# Patient Record
Sex: Female | Born: 1975 | Race: White | Hispanic: No | Marital: Married | Smoking: Never smoker
Health system: Southern US, Community
[De-identification: ages and names within clinical notes are randomized; demographics above are authoritative.]

## PROBLEM LIST (undated history)

## (undated) DIAGNOSIS — I639 Cerebral infarction, unspecified: Secondary | ICD-10-CM

## (undated) DIAGNOSIS — Z953 Presence of xenogenic heart valve: Secondary | ICD-10-CM

## (undated) HISTORY — PX: OTHER SURGICAL HISTORY: SHX169

---

## 2013-09-12 ENCOUNTER — Ambulatory Visit: Payer: Self-pay | Admitting: Emergency Medicine

## 2013-09-12 LAB — RAPID STREP-A WITH REFLX: Micro Text Report: NEGATIVE

## 2013-09-15 LAB — BETA STREP CULTURE(ARMC)

## 2014-05-20 ENCOUNTER — Ambulatory Visit: Payer: Self-pay | Admitting: Surgery

## 2014-05-22 ENCOUNTER — Ambulatory Visit: Payer: Self-pay | Admitting: Gastroenterology

## 2014-10-08 ENCOUNTER — Encounter (HOSPITAL_COMMUNITY)
Admission: EM | Disposition: A | Discharge status: Home / Self Care | Payer: Self-pay | Source: Other Acute Inpatient Hospital | Attending: Neurology

## 2014-10-08 ENCOUNTER — Encounter (HOSPITAL_COMMUNITY): Payer: Self-pay | Admitting: Adult Health

## 2014-10-08 ENCOUNTER — Inpatient Hospital Stay (HOSPITAL_COMMUNITY)
Admission: EM | Admit: 2014-10-08 | Discharge: 2014-10-13 | DRG: 023 | Disposition: A | Discharge status: Home / Self Care | Payer: 59 | Source: Other Acute Inpatient Hospital | Attending: Neurology | Admitting: Neurology

## 2014-10-08 ENCOUNTER — Emergency Department: Payer: Self-pay | Admitting: Emergency Medicine

## 2014-10-08 ENCOUNTER — Emergency Department (HOSPITAL_COMMUNITY): Admission: EM | Admit: 2014-10-08 | Payer: Self-pay | Source: Home / Self Care

## 2014-10-08 ENCOUNTER — Other Ambulatory Visit (HOSPITAL_COMMUNITY): Payer: Self-pay | Admitting: Neurology

## 2014-10-08 ENCOUNTER — Inpatient Hospital Stay (HOSPITAL_COMMUNITY): Payer: 59 | Admitting: Anesthesiology

## 2014-10-08 ENCOUNTER — Inpatient Hospital Stay (HOSPITAL_COMMUNITY): Payer: 59

## 2014-10-08 ENCOUNTER — Ambulatory Visit (HOSPITAL_COMMUNITY)
Admission: RE | Admit: 2014-10-08 | Discharge: 2014-10-08 | Disposition: A | Discharge status: Home / Self Care | Payer: 59 | Source: Ambulatory Visit | Attending: Neurology | Admitting: Neurology

## 2014-10-08 DIAGNOSIS — R9389 Abnormal findings on diagnostic imaging of other specified body structures: Secondary | ICD-10-CM

## 2014-10-08 DIAGNOSIS — Z954 Presence of other heart-valve replacement: Secondary | ICD-10-CM

## 2014-10-08 DIAGNOSIS — E785 Hyperlipidemia, unspecified: Secondary | ICD-10-CM | POA: Insufficient documentation

## 2014-10-08 DIAGNOSIS — R2990 Unspecified symptoms and signs involving the nervous system: Secondary | ICD-10-CM

## 2014-10-08 DIAGNOSIS — Z952 Presence of prosthetic heart valve: Secondary | ICD-10-CM | POA: Diagnosis not present

## 2014-10-08 DIAGNOSIS — I63412 Cerebral infarction due to embolism of left middle cerebral artery: Secondary | ICD-10-CM | POA: Diagnosis present

## 2014-10-08 DIAGNOSIS — H5347 Heteronymous bilateral field defects: Secondary | ICD-10-CM | POA: Diagnosis present

## 2014-10-08 DIAGNOSIS — D72829 Elevated white blood cell count, unspecified: Secondary | ICD-10-CM | POA: Diagnosis present

## 2014-10-08 DIAGNOSIS — I639 Cerebral infarction, unspecified: Secondary | ICD-10-CM

## 2014-10-08 DIAGNOSIS — Q251 Coarctation of aorta: Secondary | ICD-10-CM

## 2014-10-08 DIAGNOSIS — D649 Anemia, unspecified: Secondary | ICD-10-CM | POA: Diagnosis not present

## 2014-10-08 DIAGNOSIS — J9600 Acute respiratory failure, unspecified whether with hypoxia or hypercapnia: Secondary | ICD-10-CM

## 2014-10-08 DIAGNOSIS — R4701 Aphasia: Secondary | ICD-10-CM | POA: Diagnosis present

## 2014-10-08 DIAGNOSIS — R1310 Dysphagia, unspecified: Secondary | ICD-10-CM | POA: Diagnosis not present

## 2014-10-08 DIAGNOSIS — G8191 Hemiplegia, unspecified affecting right dominant side: Secondary | ICD-10-CM | POA: Diagnosis present

## 2014-10-08 DIAGNOSIS — Z6839 Body mass index (BMI) 39.0-39.9, adult: Secondary | ICD-10-CM | POA: Diagnosis not present

## 2014-10-08 DIAGNOSIS — J95821 Acute postprocedural respiratory failure: Secondary | ICD-10-CM | POA: Diagnosis not present

## 2014-10-08 DIAGNOSIS — Z7901 Long term (current) use of anticoagulants: Secondary | ICD-10-CM | POA: Diagnosis not present

## 2014-10-08 DIAGNOSIS — I6789 Other cerebrovascular disease: Secondary | ICD-10-CM

## 2014-10-08 DIAGNOSIS — I10 Essential (primary) hypertension: Secondary | ICD-10-CM | POA: Diagnosis present

## 2014-10-08 DIAGNOSIS — I6522 Occlusion and stenosis of left carotid artery: Secondary | ICD-10-CM | POA: Diagnosis present

## 2014-10-08 HISTORY — DX: Presence of xenogenic heart valve: Z95.3

## 2014-10-08 HISTORY — PX: RADIOLOGY WITH ANESTHESIA: SHX6223

## 2014-10-08 LAB — BLOOD GAS, ARTERIAL
Acid-base deficit: 3 mmol/L — ABNORMAL HIGH (ref 0.0–2.0)
Bicarbonate: 20.2 mEq/L (ref 20.0–24.0)
FIO2: 1 %
MECHVT: 480 mL
O2 Saturation: 99.7 %
PEEP: 5 cmH2O
PH ART: 7.46 — AB (ref 7.350–7.450)
Patient temperature: 98.6
RATE: 16 resp/min
TCO2: 21.1 mmol/L (ref 0–100)
pCO2 arterial: 28.8 mmHg — ABNORMAL LOW (ref 35.0–45.0)
pO2, Arterial: 232 mmHg — ABNORMAL HIGH (ref 80.0–100.0)

## 2014-10-08 LAB — COMPREHENSIVE METABOLIC PANEL
ALK PHOS: 65 U/L (ref 39–117)
ALT: 65 U/L — AB (ref 0–35)
ANION GAP: 8 (ref 5–15)
AST: 56 U/L — ABNORMAL HIGH (ref 0–37)
Albumin: 3.4 g/dL — ABNORMAL LOW (ref 3.5–5.2)
BUN: 8 mg/dL (ref 6–23)
CALCIUM: 8.5 mg/dL (ref 8.4–10.5)
CO2: 22 mmol/L (ref 19–32)
Chloride: 106 mmol/L (ref 96–112)
Creatinine, Ser: 0.73 mg/dL (ref 0.50–1.10)
GFR calc Af Amer: >90 mL/min (ref >90)
Glucose, Bld: 118 mg/dL — ABNORMAL HIGH (ref 70–99)
Potassium: 3.8 mmol/L (ref 3.5–5.1)
SODIUM: 136 mmol/L (ref 135–145)
TOTAL PROTEIN: 6.4 g/dL (ref 6.0–8.3)
Total Bilirubin: 0.8 mg/dL (ref 0.3–1.2)

## 2014-10-08 LAB — CBC
HCT: 36.6 % (ref 36.0–46.0)
HEMOGLOBIN: 11.8 g/dL — AB (ref 12.0–15.0)
MCH: 26.4 pg (ref 26.0–34.0)
MCHC: 32.2 g/dL (ref 30.0–36.0)
MCV: 81.9 fL (ref 78.0–100.0)
PLATELETS: 267 10*3/uL (ref 150–400)
RBC: 4.47 MIL/uL (ref 3.87–5.11)
RDW: 16.1 % — ABNORMAL HIGH (ref 11.5–15.5)
WBC: 17.3 10*3/uL — AB (ref 4.0–10.5)

## 2014-10-08 LAB — MRSA PCR SCREENING: MRSA by PCR: NEGATIVE

## 2014-10-08 SURGERY — RADIOLOGY WITH ANESTHESIA
Anesthesia: General

## 2014-10-08 MED ORDER — PROPOFOL 10 MG/ML IV EMUL
INTRAVENOUS | Status: AC
  Filled 2014-10-08: qty 100

## 2014-10-08 MED ORDER — INFLUENZA VAC SPLIT QUAD 0.5 ML IM SUSY
0.5000 mL | PREFILLED_SYRINGE | INTRAMUSCULAR | Status: DC
  Filled 2014-10-08: qty 0.5

## 2014-10-08 MED ORDER — LACTATED RINGERS IV SOLN
INTRAVENOUS | Status: DC | PRN
  Administered 2014-10-08: 10:00:00 via INTRAVENOUS

## 2014-10-08 MED ORDER — LIDOCAINE HCL 1 % IJ SOLN
INTRAMUSCULAR | Status: AC
  Filled 2014-10-08: qty 20

## 2014-10-08 MED ORDER — STROKE: EARLY STAGES OF RECOVERY BOOK
Freq: Once | Status: AC
  Administered 2014-10-08: 13:00:00
  Filled 2014-10-08: qty 1

## 2014-10-08 MED ORDER — EPTIFIBATIDE 2 MG/ML IV SOLN
INTRAVENOUS | Status: AC
  Administered 2014-10-08 (×3): 2 mg
  Administered 2014-10-08: 1 mg
  Filled 2014-10-08: qty 10

## 2014-10-08 MED ORDER — ACETAMINOPHEN 650 MG RE SUPP
650.0000 mg | Freq: Four times a day (QID) | RECTAL | Status: DC | PRN
Start: 2014-10-08 — End: 2014-10-10

## 2014-10-08 MED ORDER — CHLORHEXIDINE GLUCONATE 0.12 % MT SOLN
15.0000 mL | Freq: Two times a day (BID) | OROMUCOSAL | Status: DC
  Administered 2014-10-08 – 2014-10-09 (×2): 15 mL via OROMUCOSAL
  Filled 2014-10-08 (×2): qty 15

## 2014-10-08 MED ORDER — EPHEDRINE SULFATE 50 MG/ML IJ SOLN
INTRAMUSCULAR | Status: DC | PRN
  Administered 2014-10-08: 5 mg via INTRAVENOUS

## 2014-10-08 MED ORDER — CEFAZOLIN SODIUM-DEXTROSE 2-3 GM-% IV SOLR
INTRAVENOUS | Status: AC
  Filled 2014-10-08: qty 50

## 2014-10-08 MED ORDER — FENTANYL CITRATE 0.05 MG/ML IJ SOLN
25.0000 ug | INTRAMUSCULAR | Status: DC | PRN
  Administered 2014-10-08 (×2): 50 ug via INTRAVENOUS
  Administered 2014-10-09 (×2): 25 ug via INTRAVENOUS
  Administered 2014-10-09 (×2): 50 ug via INTRAVENOUS
  Filled 2014-10-08 (×6): qty 2

## 2014-10-08 MED ORDER — HEPARIN SODIUM (PORCINE) 1000 UNIT/ML IJ SOLN
INTRAMUSCULAR | Status: DC | PRN
  Administered 2014-10-08: 1000 [IU] via INTRAVENOUS

## 2014-10-08 MED ORDER — SUCCINYLCHOLINE CHLORIDE 20 MG/ML IJ SOLN
INTRAMUSCULAR | Status: DC | PRN
  Administered 2014-10-08: 100 mg via INTRAVENOUS

## 2014-10-08 MED ORDER — FENTANYL CITRATE 0.05 MG/ML IJ SOLN
INTRAMUSCULAR | Status: DC | PRN
  Administered 2014-10-08 (×2): 25 ug via INTRAVENOUS
  Administered 2014-10-08: 50 ug via INTRAVENOUS
  Administered 2014-10-08 (×2): 25 ug via INTRAVENOUS

## 2014-10-08 MED ORDER — EPTIFIBATIDE 2 MG/ML IV SOLN
INTRAVENOUS | Status: AC
  Filled 2014-10-08: qty 10

## 2014-10-08 MED ORDER — CETYLPYRIDINIUM CHLORIDE 0.05 % MT LIQD
7.0000 mL | Freq: Four times a day (QID) | OROMUCOSAL | Status: DC
  Administered 2014-10-09 (×2): 7 mL via OROMUCOSAL

## 2014-10-08 MED ORDER — NICARDIPINE HCL IN NACL 20-0.86 MG/200ML-% IV SOLN
5.0000 mg/h | INTRAVENOUS | Status: DC
  Administered 2014-10-08: 5 mg/h via INTRAVENOUS

## 2014-10-08 MED ORDER — PROPOFOL 10 MG/ML IV BOLUS
INTRAVENOUS | Status: DC | PRN
  Administered 2014-10-08: 20 mg via INTRAVENOUS
  Administered 2014-10-08: 120 mg via INTRAVENOUS
  Administered 2014-10-08 (×3): 10 mg via INTRAVENOUS
  Administered 2014-10-08: 30 mg via INTRAVENOUS

## 2014-10-08 MED ORDER — ARTIFICIAL TEARS OP OINT
TOPICAL_OINTMENT | OPHTHALMIC | Status: DC | PRN
  Administered 2014-10-08: 1 via OPHTHALMIC

## 2014-10-08 MED ORDER — PHENYLEPHRINE HCL 10 MG/ML IJ SOLN
INTRAMUSCULAR | Status: DC | PRN
  Administered 2014-10-08: 120 ug via INTRAVENOUS

## 2014-10-08 MED ORDER — ASPIRIN 300 MG RE SUPP
300.0000 mg | Freq: Every day | RECTAL | Status: DC
  Administered 2014-10-08 – 2014-10-09 (×2): 300 mg via RECTAL
  Filled 2014-10-08 (×3): qty 1

## 2014-10-08 MED ORDER — ROCURONIUM BROMIDE 100 MG/10ML IV SOLN
INTRAVENOUS | Status: DC | PRN
  Administered 2014-10-08: 15 mg via INTRAVENOUS
  Administered 2014-10-08: 10 mg via INTRAVENOUS
  Administered 2014-10-08: 25 mg via INTRAVENOUS
  Administered 2014-10-08: 50 mg via INTRAVENOUS

## 2014-10-08 MED ORDER — NICARDIPINE HCL IN NACL 20-0.86 MG/200ML-% IV SOLN
INTRAVENOUS | Status: AC
Start: 2014-10-08 — End: 2014-10-08
  Filled 2014-10-08: qty 200

## 2014-10-08 MED ORDER — PANTOPRAZOLE SODIUM 40 MG IV SOLR
40.0000 mg | INTRAVENOUS | Status: DC
  Administered 2014-10-08: 40 mg via INTRAVENOUS
  Filled 2014-10-08: qty 40

## 2014-10-08 MED ORDER — PROPOFOL 10 MG/ML IV EMUL
5.0000 ug/kg/min | INTRAVENOUS | Status: DC
  Administered 2014-10-08: 20 ug/kg/min via INTRAVENOUS
  Administered 2014-10-08: 10 ug/kg/min via INTRAVENOUS
  Administered 2014-10-09: 15 ug/kg/min via INTRAVENOUS
  Filled 2014-10-08 (×2): qty 100

## 2014-10-08 MED ORDER — ACETAMINOPHEN 500 MG PO TABS
1000.0000 mg | ORAL_TABLET | Freq: Four times a day (QID) | ORAL | Status: DC | PRN
  Administered 2014-10-09 – 2014-10-10 (×2): 1000 mg via ORAL
  Filled 2014-10-08 (×2): qty 2

## 2014-10-08 MED ORDER — ONDANSETRON HCL 4 MG/2ML IJ SOLN
INTRAMUSCULAR | Status: DC | PRN
  Administered 2014-10-08: 4 mg via INTRAVENOUS

## 2014-10-08 MED ORDER — NITROGLYCERIN 1 MG/10 ML FOR IR/CATH LAB
INTRA_ARTERIAL | Status: AC
  Administered 2014-10-08 (×5): 25 ug
  Filled 2014-10-08: qty 10

## 2014-10-08 MED ORDER — ONDANSETRON HCL 4 MG/2ML IJ SOLN: 4.0000 mg | Freq: Once | INTRAMUSCULAR | Status: DC | PRN

## 2014-10-08 MED ORDER — IOHEXOL 300 MG/ML  SOLN
150.0000 mL | Freq: Once | INTRAMUSCULAR | Status: AC | PRN
  Administered 2014-10-08: 150 mL via INTRAVENOUS

## 2014-10-08 MED ORDER — ASPIRIN 325 MG PO TABS
325.0000 mg | ORAL_TABLET | Freq: Every day | ORAL | Status: DC
  Administered 2014-10-10 – 2014-10-13 (×4): 325 mg via ORAL
  Filled 2014-10-08 (×5): qty 1

## 2014-10-08 MED ORDER — ONDANSETRON HCL 4 MG/2ML IJ SOLN: 4.0000 mg | Freq: Four times a day (QID) | INTRAMUSCULAR | Status: DC | PRN

## 2014-10-08 MED ORDER — PROPOFOL 10 MG/ML IV EMUL: 5.0000 ug/kg/min | INTRAVENOUS | Status: DC

## 2014-10-08 MED ORDER — SODIUM CHLORIDE 0.9 % IV SOLN
INTRAVENOUS | Status: DC
  Administered 2014-10-08 – 2014-10-09 (×3): via INTRAVENOUS

## 2014-10-08 MED ORDER — CEFAZOLIN SODIUM-DEXTROSE 2-3 GM-% IV SOLR
INTRAVENOUS | Status: DC | PRN
  Administered 2014-10-08: 2 g via INTRAVENOUS

## 2014-10-08 MED ORDER — LIDOCAINE HCL (CARDIAC) 20 MG/ML IV SOLN
INTRAVENOUS | Status: DC | PRN
  Administered 2014-10-08: 100 mg via INTRAVENOUS

## 2014-10-08 MED ORDER — HYDROMORPHONE HCL 1 MG/ML IJ SOLN: 0.2500 mg | INTRAMUSCULAR | Status: DC | PRN

## 2014-10-08 NOTE — Anesthesia Postprocedure Evaluation (Signed)
  Anesthesia Post-op Note  Patient: Nichole Black  Procedure(s) Performed: Procedure(s): RADIOLOGY WITH ANESTHESIA (N/A)  Patient Location: SICU  Anesthesia Type:General  Level of Consciousness: sedated and Patient remains intubated per anesthesia plan  Airway and Oxygen Therapy: Patient remains intubated per anesthesia plan and Patient placed on Ventilator (see vital sign flow sheet for setting)  Post-op Pain: none  Post-op Assessment: Post-op Vital signs reviewed, Patient's Cardiovascular Status Stable, Respiratory Function Stable, Patent Airway and No signs of Nausea or vomiting  Post-op Vital Signs: stable  Last Vitals:  Filed Vitals:   10/08/14 1300  BP: 127/69  Pulse: 77  Resp: 27    Complications: No apparent anesthesia complications

## 2014-10-08 NOTE — Anesthesia Procedure Notes (Signed)
Procedure Name: Intubation Date/Time: 10/08/2014 9:39 AM Performed by: Fransisca KaufmannMEYER, Irianna Gilday E Pre-anesthesia Checklist: Patient identified, Emergency Drugs available, Suction available, Patient being monitored and Timeout performed Patient Re-evaluated:Patient Re-evaluated prior to inductionOxygen Delivery Method: Circle system utilized Preoxygenation: Pre-oxygenation with 100% oxygen Intubation Type: IV induction and Rapid sequence Ventilation: Mask ventilation without difficulty Grade View: Grade I Tube type: Subglottic suction tube Tube size: 7.5 mm Number of attempts: 1 Airway Equipment and Method: Stylet Placement Confirmation: ETT inserted through vocal cords under direct vision and positive ETCO2 Secured at: 23 cm Tube secured with: Tape Dental Injury: Teeth and Oropharynx as per pre-operative assessment

## 2014-10-08 NOTE — Transfer of Care (Signed)
Immediate Anesthesia Transfer of Care Note  Patient: Nichole Black  Procedure(s) Performed: Procedure(s): RADIOLOGY WITH ANESTHESIA (N/A)  Patient Location: PACU and NICU  Anesthesia Type:General  Level of Consciousness: Patient remains intubated per anesthesia plan  Airway & Oxygen Therapy: Patient remains intubated per anesthesia plan and Patient placed on Ventilator (see vital sign flow sheet for setting)  Post-op Assessment: Report given to RN and Post -op Vital signs reviewed and stable  Post vital signs: Reviewed and stable  Last Vitals:  Filed Vitals:   10/08/14 1235  Pulse: 60  Resp: 20    Complications: No apparent anesthesia complications

## 2014-10-08 NOTE — Progress Notes (Signed)
VASCULAR LAB PRELIMINARY  PRELIMINARY  PRELIMINARY  PRELIMINARY  Carotid Duplex completed.    Preliminary report:  RT ICA/CCA ratio: 1.06                                 LT ICA/CCA ratio:  1.72                                 1-39% RICA stenosis.                                40-69% LICA stenosis.                     Bilateral antegrade vertebral arterial flow.  Loralie ChampagneBishop, Kairen Hallinan F, RVT 10/08/2014, 4:23 PM

## 2014-10-08 NOTE — Progress Notes (Signed)
10/08/2014 1510  Interventional Radiology here at bedside to remove 499fr right groin sheath.  Pt id'ed by name and DOB.  59fr groin sheath removed. ExoSeal used,  20mins of arterial pressure until hemostasis achieved.  V-Pad applied, along with pressure dressing. RN at bedside.  No immediate complications noted. Distal pulses DP+2, PT+2.   jkc/bf

## 2014-10-08 NOTE — Progress Notes (Signed)
Revascularization time 1033

## 2014-10-08 NOTE — H&P (Signed)
Neurology H&P  CC: aphasia  History is obtained from: family, referring MD  HPI: Nichole Black is a 39 y.o. female who presented to [place] this am with right sided weakness and aphasia. Initially, she was still able to speak and told the ER physician that symptoms started about 2:30am. She was mild at first, however and got her children ready for school. Her husband then noticed her facial droop and took her to [place] ER where she continued to worsen. She was [facility] and taken directly to IR.   Her head CT shows some mild changes in the insular ribbon and parietal region but still less than 1/3 MCA territory.  Per family, she has had trouble getting her INR within range lately.    LKW: 2:30am tpa given?: no, out of window.  NIHSS: 21   ROS:  Unable to obtain due to altered mental status.   PMH: Bicuspid aortic valve s/p replacement x 2. Most recent was mechanical.   Family History: No history of similar presentation  Social History: TOB: family denies.   Exam: Current vital signs: LMP 10/03/2014 (Exact Date) Vital signs in last 24 hours:   Physical Exam  Constitutional: Appears well-developed and well-nourished.  Psych: Affect appropriate to situation Eyes: No scleral injection HENT: No OP obstrucion Head: Normocephalic.  Cardiovascular: Normal rate and regular rhythm.  Respiratory: Effort normal and breath sounds normal to anterior ascultation GI: Soft.  No distension. There is no tenderness.  Skin: WDI  Neuro: Mental Status: Patient is awake, alert, she has no verbal output but is able to answer some simple questions with nods/shakes. She follows some simple commnads, but unreliably.  Cranial Nerves: II: Right hemianopia. Pupils are equal, round, and reactive to light.   III,IV, VI: Gaze preference but easily tracks across midline.  V: Facial sensation is diminished on right VII: Facial movement is diminished on right.  VIII: hearing is intact to  voice XII: does not follow command to protrude Motor: Tone is normal. Bulk is normal. 5/5 strength was present on the left, no movement right arm, only able to wiggle toes in the leg.  Sensory: Sensation is diminished on the right.  Deep Tendon Reflexes: 2+ and symmetric in the biceps and patellae.  Cerebellar: Does not follow commands.     I have reviewed labs in epic and the results pertinent to this consultation are: WBC - leukocytosis.   I have reviewed the images obtained: CT head - changes as above.   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 She is being taken to IR for attempt for reperfusion. This is likely secondary to subtheraputic INR in the setting of mechanical valve.   Recommendations: 1. IR attempt at reperfusion 2. HgbA1c, fasting lipid panel 3. MRI the brain without contrast to evaluate extent odf the stroke 3. Frequent neuro checks 4. Echocardiogram 5. Admit to ICU  6. Prophylactic therapy-Antiplatelet med: Aspirin - dose 325mg  PO or 300mg  PR 7. Telemetry monitoring 8. PT consult, OT consult, Speech consult    This patient is critically ill and at significant risk of neurological worsening, death and care requires constant monitoring of vital signs, hemodynamics,respiratory and cardiac monitoring, neurological assessment, discussion with family, other specialists and medical decision making of high complexity. I spent 80 minutes of neurocritical care time  in the care of  this patient.  Ritta SlotMcNeill Tymeer Vaquera, MD [facility] [phone]  If 7pm- 7am, please page neurology on call as listed in AMION. 10/08/2014  10:50  AM    

## 2014-10-08 NOTE — Procedures (Signed)
S/P 4 vessel cerebral arteriogram. RT CFA approach. Findings. 1Complete revascularizatiom of occluded LT ICA terminus ,Lt MCA with x3 passes of Solitaire FR 4mm x 40 mm retrieval ,and 7 mg of IA INtegrelin superselectivelly  TICI 3 revascularization

## 2014-10-08 NOTE — Progress Notes (Signed)
1st puncture in IR 2- code stroke

## 2014-10-08 NOTE — Sedation Documentation (Signed)
1st puncture in !R 2- code stroke at 0945

## 2014-10-08 NOTE — Consult Note (Signed)
PULMONARY / CRITICAL CARE MEDICINE   Name: Nichole Black MRN: [id number] DOB: 1976-05-15    ADMISSION DATE:  10/08/2014 CONSULTATION DATE:  10/08/14  REFERRING MD :  Amada JupiterKirkpatrick   CHIEF COMPLAINT:  Vent management   INITIAL PRESENTATION: 39 yo female with hx mechanical aortic valve repalcement x 2, presented 3[facility] with R sided weakness and aphasia beginning approx 2am.  She was tx [facility] and taken directly to IR for attempts at revascularization.  PCCM consulted for post-procedure vent management.   STUDIES:  CT head ([place]) 3/2>>> mild changes in the insular ribbon and parietal region but still less than 1/3 MCA territory.  SIGNIFICANT EVENTS: 3/2 IR revascularization >>> occluded L ICA, L MCA with successful revascularization   HISTORY OF PRESENT ILLNESS:  39 yo female with hx mechanical aortic valve repalcement x 2, presented [facility] to [facility] with R sided weakness and aphasia beginning approx 2am.  [facility] and taken directly to IR for attempts at revascularization.  PCCM consulted for post-procedure vent management.   PAST MEDICAL HISTORY :   has a past medical history of S/P aortic valve replacement.  has no past surgical history on file. Prior to Admission medications   Not on File   Not on File  FAMILY HISTORY:  has no family status information on file.  SOCIAL HISTORY:    REVIEW OF SYSTEMS:  Unable, pt sedated on vent post procedure   SUBJECTIVE:   VITAL SIGNS: Pulse Rate:  [60-77] 77 (03/02 1300) Resp:  [20-27] 27 (03/02 1300) BP: (127)/(69) 127/69 mmHg (03/02 1300) SpO2:  [100 %] 100 % (03/02 1300) Arterial Line BP: (160)/(73) 160/73 mmHg (03/02 1300) FiO2 (%):  [100 %] 100 % (03/02 1235) Weight:  [215 lb 6.2 oz (97.7 kg)] 215 lb 6.2 oz (97.7 kg) (03/02 1300) HEMODYNAMICS:   VENTILATOR SETTINGS: Vent Mode:  [-] PRVC FiO2 (%):  [100 %] 100 % Set Rate:  [16 bmp] 16 bmp Vt Set:  [480 mL] 480 mL PEEP:  [5 cmH20]  5 cmH20 Plateau Pressure:  [19 cmH20] 19 cmH20 INTAKE / OUTPUT:  Intake/Output Summary (Last 24 hours) at 10/08/14 1329 Last data filed at 10/08/14 1245  Gross per 24 hour  Intake    600 ml  Output   1060 ml  Net   -460 ml    PHYSICAL EXAMINATION: General:  wdwn young female, NAD sedated post IR procedure  Neuro:  RASS -1, opens eyes to voice, follows commands. R hemiparesis HEENT:  Mm dry, no JVD, ETT  Cardiovascular:  s1s2 rrr, mechanical click  Lungs:  resps even, non labored, scattered rhonchi  Abdomen:  Round, soft, +bs Musculoskeletal:  Warm and dry, no edema   LABS:  CBC No results for input(s): WBC, HGB, HCT, PLT in the last 168 hours. Coag's No results for input(s): APTT, INR in the last 168 hours. BMET No results for input(s): NA, K, CL, CO2, BUN, CREATININE, GLUCOSE in the last 168 hours. Electrolytes No results for input(s): CALCIUM, MG, PHOS in the last 168 hours. Sepsis Markers No results for input(s): LATICACIDVEN, PROCALCITON, O2SATVEN in the last 168 hours. ABG No results for input(s): PHART, PCO2ART, PO2ART in the last 168 hours. Liver Enzymes No results for input(s): AST, ALT, ALKPHOS, BILITOT, ALBUMIN in the last 168 hours. Cardiac Enzymes No results for input(s): TROPONINI, PROBNP in the last 168 hours. Glucose No results for input(s): GLUCAP in the last 168 hours.  Imaging No  results found.   ASSESSMENT / PLAN:  NEUROLOGIC Acute L ICA/ L MCA stroke s/p IR revascularization in setting mechanical aortic valve (per husband pt has had difficulty getting INR therapeutic lately) P:   RASS goal: -1 WUA in am  Neuro primary  Propofol gtt  PRN fent  MRI pending   PULMONARY Vent support post Neuro IR procedure  P:   Cont vent support  SBT, WUA in am  F/u CXR  F/u ABG   CARDIOVASCULAR Hx aortic valve replacement r/t bicuspid valve  HTN P:  Hold anticoag for now s/p procedure  Will need to d/w neuro to determine timing for resuming  anticoagulation  Cont cardene gtt for BP control  Echo pending   RENAL No active issue  P:   Chem pending   GASTROINTESTINAL No active issue  P:   NPO for now  Hold off on TF as hope for extubation in am  PPI   HEMATOLOGIC Coagulopathy - anticoagulated at baseline in setting mechanic valve  P:  Cbc pending  F/u INR  Hold anticoagulation for now  SCD's    INFECTIOUS No active issue  P:   Monitor WBC, fever curve off abx   ENDOCRINE No active issue    P:   Monitor glucose on chem     FAMILY  - Updates:  Husband, sister in law updated at length at bedside   - Inter-disciplinary family meet or Palliative Care meeting due by: 3/9   Dirk Dress, NP 10/08/2014  1:29 PM Pager: [phone] or [phone]   PCCM ATTENDING: I have reviewed pt's initial presentation, consultants notes and hospital database in detail.  The above assessment and plan was formulated under my direction.  In summary: 66 F with mechanical heart valve Acute L hemispheric CVA s/p IR revasc procedure Subtherapeutic on warfarin (INR 1.5 @ [place])  Cont full vent support - settings reviewed and/or adjusted Cont vent bundle Daily SBT if/when meets criteria Likely extubation 3/03 SUP and DVT px confirmed Further neuro eval per Stroke team Stroke team to decide on timing of resumption of full anticoagulation for mech heart valve   Billy Fischer, MD;  PCCM service; Mobile [phone]

## 2014-10-08 NOTE — Progress Notes (Signed)
  Echocardiogram 2D Echocardiogram has been performed.  Delcie RochENNINGTON, Tod Abrahamsen 10/08/2014, 2:36 PM

## 2014-10-08 NOTE — Plan of Care (Signed)
Problem: tPA Day Progression Outcomes-Only if tPA administered Goal: Inclusion criteria for tPA STANDARD: < 3hours from symptoms onset: - Diagnosis of ischemic stroke causing measureable neurological deficit - Neurological signs shold not be minor & isolated - Symptoms of stroke should not be sugestive of SAH - No head trauma or prior stroke in previous 3 months - No gastrointestinal or urinary tract hemorrhage in previous 21 days - No arterial puncture at a noncompressible site in previous 7 days - BP not elevated [systolic <459 mmHg, diastolic < 977 mmHg] - Not taking oral anticoagulant or if being taken INR < or equal 1.7 - Platelet count > or equal 100,000 mm3 - No seizure w/postictal residual neurological impairments - Pt/family understand potential risks/benefits from treatment - Caution in pts with NIHSS > 22 - Seizure at stroke onset eligible for tPA if residual impairments due to stroke and not the seizures. - Neurological signs should not be clearing spontaneously - Exercise caution in treating pt with major deficits - Symptoms onset < 3hrs before beginning treatment - No MI in previous 3 months - No major surgery in previous 14 days - No history previous intracranial hemorrhage - No evidence active bleeding/acute trauma (fracture) on examinations - If receiving heparin in previous 48 hrs, aPTT must be normal range - Abnormal Blood Glucose < 50 or > 400 mg/dL - CT does not show multilobar infarction [hypodensity >1/3 cerebral hemisphere] EXTENDED: 3-4.5 hrous from symptom onset - Age > 80 years - History of prior stroke and diabetes - Any anticoagulant use prior to admission (even if INR < 1.7) - NIHSS > 25" Outcome: Completed/Met Date Met:  10/08/14 Pt outside of tpa window.  Did not receive tpa

## 2014-10-08 NOTE — Progress Notes (Signed)
Clarification: Revascularization time was 1055; not 1033

## 2014-10-08 NOTE — Progress Notes (Signed)
UR completed.  Carlton Sweaney, RN BSN MHA CCM Trauma/Neuro ICU Case Manager [phone]  

## 2014-10-08 NOTE — Progress Notes (Signed)
eLink Physician-Brief Progress Note Patient Name: Nichole Black DOB: 06/26/1976 MRN: [id number]   Date of Service  10/08/2014  HPI/Events of Note  Called for mechanical ventilator orders. Will order current ventilator settings.  eICU Interventions  Will order 40%/PRVC 16/TV 480/P 5.     Intervention Category Major Interventions: Respiratory failure - evaluation and management  Corban Kistler Eugene 10/08/2014, 8:10 PM

## 2014-10-08 NOTE — Anesthesia Preprocedure Evaluation (Signed)
Anesthesia Evaluation  Patient identified by MRN, date of birth, ID band Patient unresponsive  Preop documentation limited or incomplete due to emergent nature of procedure.  Airway        Dental   Pulmonary          Cardiovascular     Neuro/Psych    GI/Hepatic   Endo/Other    Renal/GU      Musculoskeletal   Abdominal   Peds  Hematology   Anesthesia Other Findings   Reproductive/Obstetrics                             Anesthesia Physical Anesthesia Plan  ASA: IV  Anesthesia Plan: General   Post-op Pain Management:    Induction: Intravenous  Airway Management Planned: Oral ETT  Additional Equipment: Arterial line  Intra-op Plan:   Post-operative Plan: Post-operative intubation/ventilation  Informed Consent:   Plan Discussed with: CRNA, Anesthesiologist and Surgeon  Anesthesia Plan Comments:         Anesthesia Quick Evaluation

## 2014-10-09 ENCOUNTER — Encounter (HOSPITAL_COMMUNITY): Payer: Self-pay | Admitting: Radiology

## 2014-10-09 ENCOUNTER — Inpatient Hospital Stay (HOSPITAL_COMMUNITY): Payer: 59

## 2014-10-09 DIAGNOSIS — J95821 Acute postprocedural respiratory failure: Secondary | ICD-10-CM

## 2014-10-09 DIAGNOSIS — E785 Hyperlipidemia, unspecified: Secondary | ICD-10-CM

## 2014-10-09 DIAGNOSIS — G8190 Hemiplegia, unspecified affecting unspecified side: Secondary | ICD-10-CM

## 2014-10-09 DIAGNOSIS — Z952 Presence of prosthetic heart valve: Secondary | ICD-10-CM | POA: Insufficient documentation

## 2014-10-09 DIAGNOSIS — Z7901 Long term (current) use of anticoagulants: Secondary | ICD-10-CM | POA: Insufficient documentation

## 2014-10-09 DIAGNOSIS — I63412 Cerebral infarction due to embolism of left middle cerebral artery: Principal | ICD-10-CM

## 2014-10-09 LAB — LIPID PANEL
CHOL/HDL RATIO: 4.2 ratio
CHOLESTEROL: 148 mg/dL (ref 0–200)
HDL: 35 mg/dL — ABNORMAL LOW (ref >39)
LDL Cholesterol: 80 mg/dL (ref 0–99)
Triglycerides: 166 mg/dL — ABNORMAL HIGH (ref <150)
VLDL: 33 mg/dL (ref 0–40)

## 2014-10-09 LAB — BASIC METABOLIC PANEL
Anion gap: 10 (ref 5–15)
BUN: 8 mg/dL (ref 6–23)
CALCIUM: 8.3 mg/dL — AB (ref 8.4–10.5)
CO2: 23 mmol/L (ref 19–32)
CREATININE: 0.64 mg/dL (ref 0.50–1.10)
Chloride: 104 mmol/L (ref 96–112)
GFR calc Af Amer: >90 mL/min (ref >90)
GFR calc non Af Amer: >90 mL/min (ref >90)
Glucose, Bld: 108 mg/dL — ABNORMAL HIGH (ref 70–99)
POTASSIUM: 3.7 mmol/L (ref 3.5–5.1)
SODIUM: 137 mmol/L (ref 135–145)

## 2014-10-09 LAB — CBC WITH DIFFERENTIAL/PLATELET
BASOS ABS: 0 10*3/uL (ref 0.0–0.1)
Basophils Relative: 0 % (ref 0–1)
EOS ABS: 0 10*3/uL (ref 0.0–0.7)
EOS PCT: 0 % (ref 0–5)
HEMATOCRIT: 32.9 % — AB (ref 36.0–46.0)
Hemoglobin: 10.3 g/dL — ABNORMAL LOW (ref 12.0–15.0)
Lymphocytes Relative: 17 % (ref 12–46)
Lymphs Abs: 1.9 10*3/uL (ref 0.7–4.0)
MCH: 25.6 pg — ABNORMAL LOW (ref 26.0–34.0)
MCHC: 31.3 g/dL (ref 30.0–36.0)
MCV: 81.8 fL (ref 78.0–100.0)
MONO ABS: 1 10*3/uL (ref 0.1–1.0)
Monocytes Relative: 9 % (ref 3–12)
Neutro Abs: 8.3 10*3/uL — ABNORMAL HIGH (ref 1.7–7.7)
Neutrophils Relative %: 74 % (ref 43–77)
PLATELETS: 231 10*3/uL (ref 150–400)
RBC: 4.02 MIL/uL (ref 3.87–5.11)
RDW: 16.1 % — ABNORMAL HIGH (ref 11.5–15.5)
WBC: 11.2 10*3/uL — AB (ref 4.0–10.5)

## 2014-10-09 LAB — PROTIME-INR
INR: 1.49 (ref 0.00–1.49)
Prothrombin Time: 18.2 seconds — ABNORMAL HIGH (ref 11.6–15.2)

## 2014-10-09 MED ORDER — WARFARIN - PHARMACIST DOSING INPATIENT: Freq: Every day | Status: DC

## 2014-10-09 MED ORDER — CETYLPYRIDINIUM CHLORIDE 0.05 % MT LIQD: 7.0000 mL | Freq: Two times a day (BID) | OROMUCOSAL | Status: DC

## 2014-10-09 MED ORDER — PRAVASTATIN SODIUM 20 MG PO TABS
20.0000 mg | ORAL_TABLET | Freq: Every day | ORAL | Status: DC
  Administered 2014-10-09 – 2014-10-12 (×4): 20 mg via ORAL
  Filled 2014-10-09 (×5): qty 1

## 2014-10-09 MED ORDER — PANTOPRAZOLE SODIUM 40 MG PO TBEC
40.0000 mg | DELAYED_RELEASE_TABLET | Freq: Every day | ORAL | Status: DC
  Administered 2014-10-09 – 2014-10-13 (×5): 40 mg via ORAL
  Filled 2014-10-09 (×5): qty 1

## 2014-10-09 MED ORDER — ENOXAPARIN SODIUM 40 MG/0.4ML ~~LOC~~ SOLN
40.0000 mg | SUBCUTANEOUS | Status: DC
  Administered 2014-10-09 – 2014-10-12 (×4): 40 mg via SUBCUTANEOUS
  Filled 2014-10-09 (×5): qty 0.4

## 2014-10-09 MED ORDER — WARFARIN SODIUM 10 MG PO TABS
10.0000 mg | ORAL_TABLET | Freq: Once | ORAL | Status: AC
  Administered 2014-10-09: 10 mg via ORAL
  Filled 2014-10-09: qty 1

## 2014-10-09 MED ORDER — CHLORHEXIDINE GLUCONATE 0.12 % MT SOLN: 15.0000 mL | Freq: Two times a day (BID) | OROMUCOSAL | Status: DC

## 2014-10-09 NOTE — Consult Note (Signed)
Physical Medicine and Rehabilitation Consult  Reason for Consult: Right sided weakness with expressive deficits.  Referring Physician:  Dr. Roda Shutters.    HPI: Nichole Black is a 39 y.o. female with history of mechanical AVR x 2 who was admitted via [facility] on 10/08/14 with right sided weakness, right facial weakness and aphasia. Patient on chronic coumadin and has had recent difficulty maintaining INR in therapeutic range. CT head with mild changes in the insular ribbon and parietal region and patient underwent CTA with complete revascularization of occluded LT ICA terminus ,Lt MCA with x3 passes of Solitaire with clot retrieval and IA tPA. MRI brain done revealing multifocal acute ischemia L-MCA with greater than expected punctate foci. Carotid dopplers with 40-69% L-ICA stenosis.  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 To increase ASA to 325 mg till INR therapeutic. Patient extubated on 03/03 and therapy evaluations initiated. Patient with right hemiparesis and mild expressive aphasia.  Amb with PT just hours after extubation, already minA ~168ft Pt up with RN to BR at SBA level today  Review of Systems  Constitutional: Negative.   HENT: Negative.   Eyes: Negative.   Respiratory: Negative.   Cardiovascular: Negative.   Gastrointestinal: Negative.   Genitourinary: Negative.   Musculoskeletal: Negative.   Skin: Negative.   Endo/Heme/Allergies: Negative.       Past Medical History  Diagnosis Date  . S/P aortic valve replacement with bioprosthetic valve     Past Surgical History  Procedure Laterality Date  . Carbomedic top hat supra-annular aortic valve    . Radiology with anesthesia N/A 10/08/2014    Procedure: RADIOLOGY WITH ANESTHESIA;  Surgeon: Oneal Grout, MD;  Location: [facility] OR;  Service: Radiology;  Laterality: N/A;    No family history on file.    Social History:  has no tobacco,  alcohol, and drug history on file.    Allergies  Allergen Reactions  . Warfarin Sodium Itching and Other (See Comments)    Cannot take Generic, only brand name  . Hydrocodone Itching   Medications Prior to Admission  Medication Sig Dispense Refill  . warfarin (COUMADIN) 7.5 MG tablet Take 7.5 mg by mouth every evening. M,W,F take 1 tablet, all other days take 1/2 tablet    . aspirin EC 81 MG tablet Take 81 mg by mouth daily.    Marland Kitchen ibuprofen (ADVIL,MOTRIN) 200 MG tablet Take 200 mg by mouth every 6 (six) hours as needed.      Home: Home Living Family/patient expects to be discharged to:: Private residence Living Arrangements: Spouse/significant other, Children, Other relatives Available Help at Discharge: Family, Available 24 hours/day Type of Home: House Home Access: Stairs to enter Secretary/administrator of Steps: 3 Entrance Stairs-Rails: Right Home Layout: Able to live on main level with bedroom/bathroom Home Equipment: Shower seat - built in Additional Comments: Kids are 4,6,9,16; pt's mother-in-law is going to be able to be there 24/7  Lives With: Spouse, Son  Functional History: Prior Function Level of Independence: Independent Functional Status:  Mobility: Bed Mobility Overal bed mobility: Needs Assistance Bed Mobility: Supine to Sit, Sit to Supine Supine to sit: Supervision, HOB elevated Sit to supine: Supervision General bed mobility comments: transition fairly coordinated with R side lagging slightly Transfers Overall transfer level: Needs assistance Equipment used: 1 person hand held assist Transfers: Sit to/from Stand Sit to Stand: Min assist General transfer comment: Increased time to come to  full upright Ambulation/Gait Ambulation/Gait assistance: Min assist Ambulation Distance (Feet): 94 Feet Assistive device: 1 person hand held assist Gait Pattern/deviations: Step-through pattern, Decreased step length - right, Decreased stance time - right Gait  velocity: slower and guarded General Gait Details: slow and guarded with short steps,decreased time spent on R LE, narrowed BOS and dimminished/uncoordinated heel/toe gait.    ADL: ADL Overall ADL's : Needs assistance/impaired Eating/Feeding: NPO Grooming: Moderate assistance, Sitting (with dominant hand) Upper Body Bathing: Minimal assitance, Sitting Lower Body Bathing: Minimal assistance (with Mod A sit<>stand) Upper Body Dressing : Minimal assistance, Sitting Lower Body Dressing: Moderate assistance (with Mod A sit<>stand) Toilet Transfer: Moderate assistance, Ambulation (bed>around>sit in recliner behind her at her room door) Toileting- Clothing Manipulation and Hygiene: Minimal assistance (with Mod A sit<>stand)  Cognition: Cognition Overall Cognitive Status: Within Functional Limits for tasks assessed Arousal/Alertness: Awake/alert Orientation Level: Oriented X4 Attention: Sustained Sustained Attention: Appears intact Memory: Impaired Memory Impairment: Retrieval deficit, Decreased recall of new information Awareness: Appears intact Problem Solving: Appears intact Safety/Judgment: Appears intact Cognition Arousal/Alertness: Awake/alert Behavior During Therapy: WFL for tasks assessed/performed Overall Cognitive Status: Within Functional Limits for tasks assessed  Blood pressure 110/51, pulse 71, temperature 98 F (36.7 C), temperature source Oral, resp. rate 15, height 5\' 6"  (1.676 m), weight 97.7 kg (215 lb 6.2 oz), last menstrual period 10/03/2014, SpO2 93 %. Physical Exam  Nursing note and vitals reviewed. Constitutional: She is oriented to person, place, and time. She appears well-developed and well-nourished.  HENT:  Head: Normocephalic and atraumatic.  Eyes: Conjunctivae and EOM are normal. Pupils are equal, round, and reactive to light.  Neck: Normal range of motion.  Cardiovascular: Normal rate and regular rhythm.   Mechanical valve click   Respiratory:  Effort normal and breath sounds normal.  GI: Soft. Bowel sounds are normal.  Musculoskeletal: Normal range of motion.  Neurological: She is alert and oriented to person, place, and time.  Motor 5-/5 in R del, bi, tri, grip, HF, KE, ADF 5/5 on left side  LT intact in UE and LE although sensation feels different side to side No evidence of Ataxia  Psychiatric: She has a normal mood and affect.    Results for orders placed or performed during the hospital encounter of 10/08/14 (from the past 24 hour(s))  Protime-INR     Status: Abnormal   Collection Time: 10/10/14  2:30 AM  Result Value Ref Range   Prothrombin Time 18.2 (H) 11.6 - 15.2 seconds   INR 1.49 0.00 - 1.49  CBC     Status: Abnormal   Collection Time: 10/10/14  2:30 AM  Result Value Ref Range   WBC 10.4 4.0 - 10.5 K/uL   RBC 3.97 3.87 - 5.11 MIL/uL   Hemoglobin 10.2 (L) 12.0 - 15.0 g/dL   HCT 16.1 (L) 09.6 - 04.5 %   MCV 82.4 78.0 - 100.0 fL   MCH 25.7 (L) 26.0 - 34.0 pg   MCHC 31.2 30.0 - 36.0 g/dL   RDW 40.9 (H) 81.1 - 91.4 %   Platelets 236 150 - 400 K/uL  Basic metabolic panel     Status: None   Collection Time: 10/10/14  2:30 AM  Result Value Ref Range   Sodium 139 135 - 145 mmol/L   Potassium 3.9 3.5 - 5.1 mmol/L   Chloride 107 96 - 112 mmol/L   CO2 25 19 - 32 mmol/L   Glucose, Bld 99 70 - 99 mg/dL   BUN 10 6 - 23  mg/dL   Creatinine, Ser 1.610.80 0.50 - 1.10 mg/dL   Calcium 8.6 8.4 - 09.610.5 mg/dL   GFR calc non Af Amer >90 >90 mL/min   GFR calc Af Amer >90 >90 mL/min   Anion gap 7 5 - 15   Ct Head Wo Contrast  10/08/2014   CLINICAL DATA:  Right-sided weakness. Stroke intervention earlier today.  EXAM: CT HEAD WITHOUT CONTRAST  TECHNIQUE: Contiguous axial images were obtained from the base of the skull through the vertex without intravenous contrast.  COMPARISON:  CT hand intervention films earlier same day  FINDINGS: Post intervention scan shows intravascular contrast. There is staining in the left MCA branches  probably indicating the absence of flow. There is loss of gray-white differentiation developing in the left temporal lobe in frontoparietal region consistent with infarction in the left MCA territory. No swelling or mass effect at this time. No intraparenchymal hematoma. No hydrocephalus. No extra-axial collection.  IMPRESSION: Loss of gray-white differentiation an early swelling in the left temporal lobe and frontoparietal region consistent with left MCA region infarction. Hyperdensity associated with left MCA branches related to contrast persistence falling intervention, likely indicating absence of flow in those vessels.   Electronically Signed   By: Paulina FusiMark  Shogry M.D.   On: 10/08/2014 12:29   Mr Maxine GlennMra Head Wo Contrast  10/09/2014   CLINICAL DATA:  RIGHT-sided weakness and aphasia at 2:30 a.m., RIGHT facial droop. Patient was taken for emergent angiography, status post revascularization of occluded LEFT internal carotid artery terminus extending to LEFT middle cerebral artery. Subsequent the without evaluation.  EXAM: MRI HEAD WITHOUT CONTRAST  MRA HEAD WITHOUT CONTRAST  TECHNIQUE: Multiplanar, multiecho pulse sequences of the brain and surrounding structures were obtained without intravenous contrast. Angiographic images of the head were obtained using MRA technique without contrast.  COMPARISON:  CT of the head October 08, 2014  FINDINGS: MRI HEAD FINDINGS  Patchy areas of reduced diffusion within the LEFT insula, subcentimeter area reduced diffusion in LEFT corona radiata, which show low ADC values consistent with acute ischemia. Scattered predominately tiny peripheral foci of susceptibility artifact in the supratentorial brain. No midline shift, mass effect or mass lesions.  Scattered subcentimeter white matter supratentorial T2 hyperintensities. Minimal FLAIR hyperintense signal within the LEFT insula. No abnormal extra-axial fluid collections.  Mild paranasal sinus mucosal thickening with sphenoid air-fluid  level. The mastoid air cells are well aerated. Ocular globes and orbital contents are unremarkable. No abnormal sellar expansion. No cerebellar tonsillar ectopia. No suspicious calvarial bone marrow signal.  MRA HEAD FINDINGS  Anterior circulation: Normal flow related enhancement of the included cervical, petrous, cavernous and supra clinoid internal carotid arteries. Patent anterior communicating artery. 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 Normal flow related enhancement of the anterior and middle cerebral arteries, including more distal segments. Mild luminal regularity of the LEFT M1 segment.  No large vessel occlusion, high-grade stenosis, aneurysm.  Posterior circulation: Codominant vertebral arteries. Basilar artery is patent, with normal flow related enhancement of the main branch vessels. Asymmetric decreased flow related enhancement of the mid to distal RIGHT posterior cerebral artery.  No large vessel occlusion, high-grade stenosis, abnormal luminal irregularity, aneurysm.  IMPRESSION: MRI HEAD: Multifocal acute ischemia within LEFT middle cerebral artery territory.  Greater than expected punctate foci of susceptibility artifact, greater than expected for age. Differential diagnosis includes prior head injury, remote PRES, hypertensive etiology. Recommend followup MRI of the brain within 6 months including gadolinium  to verify stability of findings.  MRA HEAD: No large vessel occlusion, mild luminal irregularity of the LEFT M1 segment may reflect sequelae of recent recanalization, atherosclerosis.  Slight decreased flow related enhancement of the mid to distal RIGHT posterior cerebral artery may reflect atherosclerosis, or even artifact.   Electronically Signed   By: Awilda Metro   On: 10/09/2014 03:54   Mr Brain Wo Contrast  10/09/2014   CLINICAL DATA:  RIGHT-sided weakness and aphasia at 2:30 a.m., RIGHT facial droop.  Patient was taken for emergent angiography, status post revascularization of occluded LEFT internal carotid artery terminus extending to LEFT middle cerebral artery. Subsequent the without evaluation.  EXAM: MRI HEAD WITHOUT CONTRAST  MRA HEAD WITHOUT CONTRAST  TECHNIQUE: Multiplanar, multiecho pulse sequences of the brain and surrounding structures were obtained without intravenous contrast. Angiographic images of the head were obtained using MRA technique without contrast.  COMPARISON:  CT of the head October 08, 2014  FINDINGS: MRI HEAD FINDINGS  Patchy areas of reduced diffusion within the LEFT insula, subcentimeter area reduced diffusion in LEFT corona radiata, which show low ADC values consistent with acute ischemia. Scattered predominately tiny peripheral foci of susceptibility artifact in the supratentorial brain. No midline shift, mass effect or mass lesions.  Scattered subcentimeter white matter supratentorial T2 hyperintensities. Minimal FLAIR hyperintense signal within the LEFT insula. No abnormal extra-axial fluid collections.  Mild paranasal sinus mucosal thickening with sphenoid air-fluid level. The mastoid air cells are well aerated. Ocular globes and orbital contents are unremarkable. No abnormal sellar expansion. No cerebellar tonsillar ectopia. No suspicious calvarial bone marrow signal.  MRA HEAD FINDINGS  Anterior circulation: Normal flow related enhancement of the included cervical, petrous, cavernous and supra clinoid internal carotid arteries. Patent anterior communicating artery. 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 Normal flow related enhancement of the anterior and middle cerebral arteries, including more distal segments. Mild luminal regularity of the LEFT M1 segment.  No large vessel occlusion, high-grade stenosis, aneurysm.  Posterior circulation: Codominant vertebral arteries. Basilar artery is patent, with normal  flow related enhancement of the main branch vessels. Asymmetric decreased flow related enhancement of the mid to distal RIGHT posterior cerebral artery.  No large vessel occlusion, high-grade stenosis, abnormal luminal irregularity, aneurysm.  IMPRESSION: MRI HEAD: Multifocal acute ischemia within LEFT middle cerebral artery territory.  Greater than expected punctate foci of susceptibility artifact, greater than expected for age. Differential diagnosis includes prior head injury, remote PRES, hypertensive etiology. Recommend followup MRI of the brain within 6 months including gadolinium to verify stability of findings.  MRA HEAD: No large vessel occlusion, mild luminal irregularity of the LEFT M1 segment may reflect sequelae of recent recanalization, atherosclerosis.  Slight decreased flow related enhancement of the mid to distal RIGHT posterior cerebral artery may reflect atherosclerosis, or even artifact.   Electronically Signed   By: Awilda Metro   On: 10/09/2014 03:54   Dg Chest Port 1 View  10/08/2014   CLINICAL DATA:  Aortic valve replacement with bioprosthetic valve. Stroke.  EXAM: PORTABLE CHEST - 1 VIEW  COMPARISON:  10/08/2014.  FINDINGS: Support apparatus: Endotracheal tube tip is 2.8 cm from the carina. Monitoring leads project over the chest.  Cardiomediastinal Silhouette: Within normal limits, accentuated by the low volumes. Median sternotomy and valve replacement.  Lungs: There is increased density in the RIGHT upper lobe. This is not have an appearance typical for pneumothorax. This is favored to represent a skin  fold. Pulmonary parenchymal opacities/airspace disease is less likely. Attention on follow-up is recommended. No pneumothorax.  Effusions:  None.  Other:  Surgical clips in the RIGHT axilla  IMPRESSION: 1. Endotracheal intubation with the tip 2.8 cm from the carina. 2. Opacity in the RIGHT upper lobe is new compared to recent prior exam, likely due to a skin fold or artifactual.  Attention on follow-up recommended.   Electronically Signed   By: Andreas Newport M.D.   On: 10/08/2014 16:23    Assessment/Plan: Diagnosis: Left MCA infarct with minimal hemiparesis after revascularization 1. Does the need for close, 24 hr/day medical supervision in concert with the patient's rehab needs make it unreasonable for this patient to be served in a less intensive setting? No 2. Co-Morbidities requiring supervision/potential complications: AVR 3. Due to patient education, does the patient require 24 hr/day rehab nursing? No 4. Does the patient require coordinated care of a physician, rehab nurse, PT, OT to address physical and functional deficits in the context of the above medical diagnosis(es)? No Addressing deficits in the following areas: balance, endurance, locomotion, strength, transferring, bathing, dressing and toileting 5. Can the patient actively participate in an intensive therapy program of at least 3 hrs of therapy per day at least 5 days per week? Yes 6. The potential for patient to make measurable gains while on inpatient rehab is poor since pt already at high level 7. Anticipated functional outcomes upon discharge from inpatient rehab are n/a  with PT, n/a with OT, n/a with SLP. 8. Estimated rehab length of stay to reach the above functional goals is: NA 9. Does the patient have adequate social supports and living environment to accommodate these discharge functional goals? N/A 10. Anticipated D/C setting: Home 11. Anticipated post D/C treatments: Outpatient therapy 12. Overall Rehab/Functional Prognosis: excellent  RECOMMENDATIONS: This patient's condition is appropriate for continued rehabilitative care in the following setting: Outpatient Therapy Patient has agreed to participate in recommended program. Yes Note that insurance prior authorization may be required for reimbursement for recommended care.  Comment: Already at [facility], rec oupt rehab at  [facility]    10/10/2014

## 2014-10-09 NOTE — Evaluation (Signed)
Clinical/Bedside Swallow Evaluation Patient Details  Name: Nichole Black MRN: [id number] Date of Birth: 07-16-1976  Today's Date: 10/09/2014 Time: SLP Start Time (ACUTE ONLY): 1412 SLP Stop Time (ACUTE ONLY): 1422 SLP Time Calculation (min) (ACUTE ONLY): 10 min  Past Medical History:  Past Medical History  Diagnosis Date  . S/P aortic valve replacement with bioprosthetic valve    Past Surgical History:  Past Surgical History  Procedure Laterality Date  . Carbomedic top hat supra-annular aortic valve     HPI:  Nichole Black is a 39 y.o. female who presented to [place] this am with right sided weakness and aphasia, [facility]--found to have occluded L ICA, L MCA with successful revascularization, vented and sedated. 10/09/14 extubated PMHx: mechanical aortic valve repalcement x 2   Assessment / Plan / Recommendation Clinical Impression  Pt has mildly hoarse voice with low vocal intensity and frequent throat clearing, likely due to extubation this morning. Intermittent throat clearing was observed throughout PO intake, however did not appear to increase in frequency or immediately follow swallows. Pt consumed 3 oz of water across consecutive sips without overt signs of aspiration. Given suspected pharyngeal irritation after brief intubation, would recommend Dys 3 diet and thin liquids without straw. SLP to follow for likely quick advancement and liberalization of precautions.    Aspiration Risk  Mild    Diet Recommendation Dysphagia 3 (Mechanical Soft);Thin liquid   Liquid Administration via: Cup;No straw Medication Administration: Whole meds with puree Supervision: Patient able to self feed;Intermittent supervision to cue for compensatory strategies Compensations: Slow rate;Small sips/bites Postural Changes and/or Swallow Maneuvers: Seated upright 90 degrees    Other  Recommendations Oral Care Recommendations: Oral care BID   Follow Up Recommendations   (none  anticipated for swallowing)    Frequency and Duration min 1 x/week  1 week   Pertinent [facility]    SLP Swallow Goals     Swallow Study Prior Functional Status  Type of Home: House Available Help at Discharge: Family;Available 24 hours/day    General Date of Onset: 10/08/14 HPI: Nichole Black is a 39 y.o. female who presented to [place] this am with right sided weakness and aphasia, [facility]--found to have occluded L ICA, L MCA with successful revascularization, vented and sedated. 10/09/14 extubated PMHx: mechanical aortic valve repalcement x 2 Type of Study: Bedside swallow evaluation Previous Swallow Assessment: none in chart Diet Prior to this Study: NPO Temperature Spikes Noted: Yes Respiratory Status: Room air History of Recent Intubation: Yes Length of Intubations (days): 1 days Date extubated: 10/09/14 Behavior/Cognition: Alert;Cooperative;Pleasant mood Oral Cavity - Dentition: Adequate natural dentition Self-Feeding Abilities: Able to feed self Patient Positioning: Upright in chair Baseline Vocal Quality: Hoarse;Low vocal intensity (mild)    Oral/Motor/Sensory Function Overall Oral Motor/Sensory Function: Appears within functional limits for tasks assessed   Ice Chips Ice chips: Impaired Presentation: Spoon Pharyngeal Phase Impairments: Throat Clearing - Delayed   Thin Liquid Thin Liquid: Impaired Presentation: Cup;Self Fed;Straw Pharyngeal  Phase Impairments: Throat Clearing - Delayed    Nectar Thick Nectar Thick Liquid: Not tested   Honey Thick Honey Thick Liquid: Not tested   Puree Puree: Impaired Presentation: Self Fed;Spoon Pharyngeal Phase Impairments: Throat Clearing - Delayed   Solid     Solid: Impaired Presentation: Self Fed Pharyngeal Phase Impairments: Throat Clearing - Delayed      Maxcine HamLaura Paiewonsky, M.A. CCC-SLP [phone]  Maxcine Hamaiewonsky, Coralie Stanke 10/09/2014,2:51 PM

## 2014-10-09 NOTE — Evaluation (Signed)
Occupational Therapy Evaluation Patient Details Name: Nichole Black MRN: [id number] DOB: 05/26/1976 Today's Date: 10/09/2014    History of Present Illness Nichole Black is a 39 y.o. female who presented to [place] this am with right sided weakness and aphasia, transferred to [facility] directly going to IR--found to have occluded L ICA, L MCA with successful revascularization, vented and sedated. 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 She would greatly benefit from a short stay on inpatient rehab to get back to an independent level herself as quickly as possible to then also focus on getting back to helping take care of her kids.    Follow Up Recommendations  CIR    Equipment Recommendations  None recommended by OT    Recommendations for Other Services Rehab consult     Precautions / Restrictions Precautions Precautions: Fall Restrictions Weight Bearing Restrictions: No      Mobility Bed Mobility Overal bed mobility: Needs Assistance Bed Mobility: Supine to Sit     Supine to sit: Supervision;HOB elevated        Transfers Overall transfer level: Needs assistance Equipment used: 1 person hand held assist Transfers: Sit to/from Stand Sit to Stand: Mod assist         General transfer comment: Increased time to come to full upright    Balance Overall balance assessment: Needs assistance Sitting-balance support: No upper extremity supported;Feet supported Sitting balance-Leahy Scale: Good     Standing balance support: Single extremity supported Standing balance-Leahy Scale: Poor                              ADL Overall  ADL's : Needs assistance/impaired Eating/Feeding: NPO   Grooming: Moderate assistance;Sitting (with dominant hand)   Upper Body Bathing: Minimal assitance;Sitting   Lower Body Bathing: Minimal assistance (with Mod A sit<>stand)   Upper Body Dressing : Minimal assistance;Sitting   Lower Body Dressing: Moderate assistance (with Mod A sit<>stand)   Toilet Transfer: Moderate assistance;Ambulation (bed>around>sit in recliner behind her at her room door)   Toileting- Architect and Hygiene: Minimal assistance (with Mod A sit<>stand)               Vision Vision Assessment?: Yes Eye Alignment: Within Functional Limits Ocular Range of Motion: Within Functional Limits Alignment/Gaze Preference: Within Defined Limits Tracking/Visual Pursuits: Able to track stimulus in all quads without difficulty Saccades: Within functional limits Convergence: Within functional limits Visual Fields: No apparent deficits Additional Comments: wears gradiant bifocals, reports it may be a little more blurry than normal          Pertinent Vitals/Pain Pain Assessment: No/denies pain     Hand Dominance Right   Extremity/Trunk Assessment Upper Extremity Assessment Upper Extremity Assessment: RUE deficits/detail RUE Deficits / Details: Movements slow and laborous compared to LUE RUE Sensation: decreased proprioception (mild decrease for grip; good for finger opposition with eyes closed) RUE Coordination: decreased fine motor;decreased gross motor   Lower Extremity Assessment Lower Extremity Assessment: Defer to PT evaluation       Communication Communication Communication: No difficulties   Cognition Arousal/Alertness: Awake/alert Behavior During Therapy:  WFL for tasks assessed/performed Overall Cognitive Status: Within Functional Limits for tasks assessed                                Home Living Family/patient expects to be discharged to:: Private residence Living  Arrangements: Spouse/significant other;Children;Other relatives Available Help at Discharge: Family;Available 24 hours/day Type of Home: House Home Access: Stairs to enter Entergy CorporationEntrance Stairs-Number of Steps: 3 Entrance Stairs-Rails: Right Home Layout: Able to live on main level with bedroom/bathroom     Bathroom Shower/Tub: Walk-in Pensions consultantshower;Curtain   Bathroom Toilet: Standard     Home Equipment: Shower seat - built in   Additional Comments: Kids are 4,6,9,16; pt's mother-in-law is going to be able to be there 24/7      Prior Functioning/Environment Level of Independence: Independent             OT Diagnosis: Generalized weakness;Hemiplegia dominant side   OT Problem List: Decreased strength;Decreased activity tolerance;Impaired balance (sitting and/or standing);Impaired sensation;Impaired UE functional use;Obesity;Decreased knowledge of use of DME or AE;Impaired tone   OT Treatment/Interventions: Self-care/ADL training;Therapeutic exercise;Therapeutic activities;DME and/or AE instruction;Patient/family education;Balance training    OT Goals(Current goals can be found in the care plan section) Acute Rehab OT Goals Patient Stated Goal: to be able to go home and take care of kids OT Goal Formulation: With patient Time For Goal Achievement: 10/16/14 Potential to Achieve Goals: Good  OT Frequency: Min 3X/week              End of Session Equipment Utilized During Treatment: Gait belt Nurse Communication: Mobility status  Activity Tolerance: Patient limited by fatigue Patient left: in chair;with call bell/phone within reach;with family/visitor present   Time: 1318-1350 OT Time Calculation (min): 32 min Charges:  OT General Charges $OT Visit: 1 Procedure OT Evaluation $Initial OT Evaluation Tier I: 1 Procedure OT Treatments $Self Care/Home Management : 8-22 mins  Evette GeorgesLeonard, Melonee Gerstel Eva [phone] 10/09/2014, 2:12 PM

## 2014-10-09 NOTE — Progress Notes (Signed)
Rehab Admissions Coordinator Note:  Patient was screened by Trish MageLogue, Lanice Folden M for appropriateness for an Inpatient Acute Rehab Consult.  At this time, we are recommending Inpatient Rehab consult.  Lelon FrohlichLogue, Talan Gildner M 10/09/2014, 4:06 PM  I can be reached at [phone].

## 2014-10-09 NOTE — Evaluation (Signed)
Physical Therapy Evaluation Patient Details Name: Nichole Black MRN: [id number] DOB: 1976/03/18 Today's Date: 10/09/2014   History of Present Illness  Nichole Black is a 39 y.o. female who presented to [place] this am with right sided weakness and aphasia, transferred to [facility] directly going to IR--found to have occluded L ICA, L MCA with successful revascularization, vented and sedated. 10/09/14 extubated PMHx:  mechanical aortic valve repalcement x 2  Clinical Impression  Pt admitted with/for R sided weakness and aphasia.  Pt currently limited functionally due to the problems listed. ( See problems list.)   Pt will benefit from PT to maximize function and safety in order to get ready for next venue listed below.     Follow Up Recommendations CIR    Equipment Recommendations  None recommended by PT    Recommendations for Other Services       Precautions / Restrictions Precautions Precautions: Fall Restrictions Weight Bearing Restrictions: No      Mobility  Bed Mobility Overal bed mobility: Needs Assistance Bed Mobility: Supine to Sit;Sit to Supine     Supine to sit: Supervision;HOB elevated Sit to supine: Supervision   General bed mobility comments: transition fairly coordinated with R side lagging slightly  Transfers Overall transfer level: Needs assistance Equipment used: 1 person hand held assist Transfers: Sit to/from Stand Sit to Stand: Min assist         General transfer comment: Increased time to come to full upright  Ambulation/Gait Ambulation/Gait assistance: Min assist Ambulation Distance (Feet): 94 Feet Assistive device: 1 person hand held assist Gait Pattern/deviations: Step-through pattern;Decreased step length - right;Decreased stance time - right Gait velocity: slower and guarded   General Gait Details: slow and guarded with short steps,decreased time spent on R LE, narrowed BOS and dimminished/uncoordinated heel/toe gait.  Stairs             Wheelchair Mobility    Modified Rankin (Stroke Patients Only) Modified Rankin (Stroke Patients Only) Pre-Morbid Rankin Score: No symptoms Modified Rankin: Moderately severe disability     Balance Overall balance assessment: Needs assistance Sitting-balance support: No upper extremity supported Sitting balance-Leahy Scale: Good     Standing balance support: Single extremity supported Standing balance-Leahy Scale: Poor Standing balance comment: expect will balance in standing without support soon                             Pertinent Vitals/Pain Pain Assessment: No/denies pain    Home Living Family/patient expects to be discharged to:: Private residence Living Arrangements: Spouse/significant other;Children;Other relatives Available Help at Discharge: Family;Available 24 hours/day Type of Home: House Home Access: Stairs to enter Entrance Stairs-Rails: Right Entrance Stairs-Number of Steps: 3 Home Layout: Able to live on main level with bedroom/bathroom Home Equipment: Shower seat - built in Additional Comments: Kids are 4,6,9,16; pt's mother-in-law is going to be able to be there 24/7    Prior Function Level of Independence: Independent               Hand Dominance   Dominant Hand: Right    Extremity/Trunk Assessment   Upper Extremity Assessment: RUE deficits/detail RUE Deficits / Details: Movements slow and laborous compared to LUE   RUE Sensation: decreased proprioception (mild decrease for grip; good for finger opposition with eyes closed)     Lower Extremity Assessment: RLE deficits/detail RLE Deficits / Details: grossly 4/5 in quads/hams and hip flexors with noticeable fatigue, df/pf [facility]  Communication   Communication: No difficulties  Cognition Arousal/Alertness: Awake/alert Behavior During Therapy: WFL for tasks assessed/performed Overall Cognitive Status: Within Functional Limits for tasks assessed                       General Comments General comments (skin integrity, edema, etc.): vitals maintained stable    Exercises        Assessment/Plan    PT Assessment Patient needs continued PT services  PT Diagnosis Abnormality of gait;Other (comment) (R hemiparesis)   PT Problem List Decreased strength;Decreased activity tolerance;Decreased balance;Decreased mobility;Decreased coordination  PT Treatment Interventions Gait training;Functional mobility training;Therapeutic activities;Balance training;Patient/family education;Neuromuscular re-education;Stair training   PT Goals (Current goals can be found in the Care Plan section) Acute Rehab PT Goals Patient Stated Goal: to be able to go home and take care of kids PT Goal Formulation: With patient Time For Goal Achievement: 10/16/14 Potential to Achieve Goals: Good    Frequency Min 4X/week   Barriers to discharge        Co-evaluation               End of Session   Activity Tolerance: Patient tolerated treatment well Patient left: in bed;with call bell/phone within reach;with family/visitor present Nurse Communication: Mobility status         Time: 1645-1710 PT Time Calculation (min) (ACUTE ONLY): 25 min   Charges:   PT Evaluation $Initial PT Evaluation Tier I: 1 Procedure PT Treatments $Gait Training: 8-22 mins   PT G Codes:        Nichole Black, Eliseo GumKenneth V 10/09/2014, 5:26 PM 10/09/2014  [place] BingKen Yuvia Black, PT [phone]  (pager)

## 2014-10-09 NOTE — Evaluation (Signed)
Speech Language Pathology Evaluation Patient Details Name: Nichole Black MRN: [id number] DOB: [birth year] Today's Date: 10/09/2014 Time: [id number] SLP Time Calculation (min) (ACUTE ONLY): 16 min  Problem List:  Patient Active Problem List   Diagnosis Date Noted  . Acute postoperative respiratory failure 10/09/2014  . Stroke 10/08/2014   Past Medical History:  Past Medical History  Diagnosis Date  . S/P aortic valve replacement with bioprosthetic valve    Past Surgical History:  Past Surgical History  Procedure Laterality Date  . Carbomedic top hat supra-annular aortic valve     HPI:  Nichole Black is a 39 y.o. female who presented to [facility] this am with right sided weakness and aphasia, [facility]--found to have occluded L ICA, L MCA with successful revascularization, vented and sedated. 10/09/14 extubated PMHx: mechanical aortic valve repalcement x 2   Assessment / Plan / Recommendation Clinical Impression  Pt has a mild expressive aphasia, characterized by word finding difficulties at the conversational level and during cognitively challenging tasks. Deficits were also noted with retrieval of new information. Pt will benefit from skilled SLP services to maximize cognitive-linguistic skills and functional independence. Pt will benefit from CIR level therapy given high level of independence and responsibility PTA.    SLP Assessment  Patient needs continued Speech Lanaguage Pathology Services    Follow Up Recommendations  Inpatient Rehab    Frequency and Duration min 2x/week  2 weeks   Pertinent Vitals/Pain Pain Assessment: No/denies pain   SLP Goals  Patient/Family Stated Goal: none stated Potential to Achieve Goals (ACUTE ONLY): Good  SLP Evaluation Prior Functioning  Cognitive/Linguistic Baseline: Within functional limits Type of Home: House  Lives With: Spouse;Son (4 sons) Available Help at Discharge: Family;Available 24 hours/day Vocation:  Full time employment Therapist, sports(photographer, mostly weddings)   Cognition  Overall Cognitive Status: Impaired/Different from baseline Arousal/Alertness: Awake/alert Orientation Level: Oriented X4 Attention: Sustained Sustained Attention: Appears intact Memory: Impaired Memory Impairment: Retrieval deficit;Decreased recall of new information Awareness: Appears intact Problem Solving: Appears intact Safety/Judgment: Appears intact    Comprehension  Auditory Comprehension Overall Auditory Comprehension: Appears within functional limits for tasks assessed Visual Recognition/Discrimination Discrimination: Within Function Limits Reading Comprehension Reading Status: Not tested    Expression Expression Primary Mode of Expression: Verbal Verbal Expression Overall Verbal Expression: Impaired Initiation: No impairment Level of Generative/Spontaneous Verbalization: Conversation Naming: Impairment Confrontation: Within functional limits Divergent: 75-100% accurate Verbal Errors: Other (comment);Aware of errors (anomia in conversation) Pragmatics: No impairment Non-Verbal Means of Communication: Not applicable Written Expression Dominant Hand: Right Written Expression: Not tested   Oral / Motor Oral Motor/Sensory Function Overall Oral Motor/Sensory Function: Appears within functional limits for tasks assessed Motor Speech Overall Motor Speech: Other (comment) (changes in vocal quality due to intubation)    Maxcine HamLaura Paiewonsky, M.A. CCC-SLP [phone]  Maxcine Hamaiewonsky, Taheerah Guldin 10/09/2014, 2:58 PM

## 2014-10-09 NOTE — Progress Notes (Signed)
STROKE TEAM PROGRESS NOTE   HISTORY OF PRESENT ILLNESS Stephenie AcresKelly Banka is a 39 y.o. female who presented to [place] this am with right sided weakness and aphasia. Initially, she was still able to speak and told the ER physician that symptoms started about 2:30am. She was mild at first, however and got her children ready for school. Her husband then noticed her facial droop and took her to [place] ER where she continued to worsen. [facility] to [facility].   Her head CT shows some mild changes in the insular ribbon and parietal region but still less than 1/3 MCA territory.  Per family, she has had trouble getting her INR within range lately.  LKW: 2:30am tpa given?: no, out of window.  NIHSS: 21  SUBJECTIVE (INTERVAL HISTORY) Her mom and stepfather are at the bedside. She is awake alert, following commands, but still intubated, not comfortable with the tube so far. Anticipate extubation today. Her condition is much better and the muscle strengths much improved, no gaze preference.   OBJECTIVE Temp:  [98.3 F (36.8 C)-101 F (38.3 C)] 98.4 F (36.9 C) (03/03 1549) Pulse Rate:  [61-88] 73 (03/03 1500) Cardiac Rhythm:  [-] Normal sinus rhythm (03/03 0700) Resp:  [11-24] 15 (03/03 1500) BP: (92-137)/(48-75) 124/57 mmHg (03/03 1500) SpO2:  [92 %-100 %] 100 % (03/03 1500) FiO2 (%):  [40 %] 40 % (03/03 1000)  No results for input(s): GLUCAP in the last 168 hours.  Recent Labs Lab 10/08/14 1300 10/09/14 0500  NA 136 137  K 3.8 3.7  CL 106 104  CO2 22 23  GLUCOSE 118* 108*  BUN 8 8  CREATININE 0.73 0.64  CALCIUM 8.5 8.3*    Recent Labs Lab 10/08/14 1300  AST 56*  ALT 65*  ALKPHOS 65  BILITOT 0.8  PROT 6.4  ALBUMIN 3.4*    Recent Labs Lab 10/08/14 1300 10/09/14 0500  WBC 17.3* 11.2*  NEUTROABS  --  8.3*  HGB 11.8* 10.3*  HCT 36.6 32.9*  MCV 81.9 81.8  PLT 267 231   No results for input(s): CKTOTAL, CKMB, CKMBINDEX, TROPONINI in the last  168 hours.  Recent Labs  10/09/14 0705  LABPROT 18.2*  INR 1.49   No results for input(s): COLORURINE, LABSPEC, PHURINE, GLUCOSEU, HGBUR, BILIRUBINUR, KETONESUR, PROTEINUR, UROBILINOGEN, NITRITE, LEUKOCYTESUR in the last 72 hours.  Invalid input(s): APPERANCEUR     Component Value Date/Time   CHOL 148 10/09/2014 0500   TRIG 166* 10/09/2014 0500   HDL 35* 10/09/2014 0500   CHOLHDL 4.2 10/09/2014 0500   VLDL 33 10/09/2014 0500   LDLCALC 80 10/09/2014 0500   No results found for: HGBA1C No results found for: LABOPIA, COCAINSCRNUR, LABBENZ, AMPHETMU, THCU, LABBARB  No results for input(s): ETH in the last 168 hours.  I have personally reviewed the radiological images below and agree with the radiology interpretations.  Ct Head Wo Contrast  10/08/2014    IMPRESSION: Loss of gray-white differentiation an early swelling in the left temporal lobe and frontoparietal region consistent with left MCA region infarction. Hyperdensity associated with left MCA branches related to contrast persistence falling intervention, likely indicating absence of flow in those vessels.      Mr Maxine GlennMra Head Wo Contrast  10/09/2014   IMPRESSION: MRI HEAD: Multifocal acute ischemia within LEFT middle cerebral artery territory.  Greater than expected punctate foci of susceptibility artifact, greater than expected for age. Differential diagnosis includes prior head injury, remote PRES, hypertensive etiology. Recommend  followup MRI of the brain within 6 months including gadolinium to verify stability of findings.  MRA HEAD: No large vessel occlusion, mild luminal irregularity of the LEFT M1 segment may reflect sequelae of recent recanalization, atherosclerosis.  Slight decreased flow related enhancement of the mid to distal RIGHT posterior cerebral artery may reflect atherosclerosis, or even artifact.     Dg Chest Port 1 View  10/08/2014   IMPRESSION: 1. Endotracheal intubation with the tip 2.8 cm from the carina. 2. Opacity  in the RIGHT upper lobe is new compared to recent prior exam, likely due to a skin fold or artifactual. Attention on follow-up recommended.      Carotid Doppler  RT ICA/CCA ratio: 1.06  LT ICA/CCA ratio: 1.72  1-39% RICA stenosis.  40-69% LICA stenosis.  Bilateral antegrade vertebral arterial flow.  2D Echocardiogram  - Left ventricle: The cavity size was normal. There was mild concentric hypertrophy. Systolic function was vigorous. The estimated ejection fraction was in the range of 65% to 70%. Wall motion was normal; there were no regional wall motion abnormalities. Left ventricular diastolic function parameters were normal. - Aortic valve: Mechanical aortic valve present. Appears to exhibit reasonably normal function. Peak velocity 3.45 m/s. There was trivial regurgitation. Mean gradient (S): 27 mm Hg. - Mitral valve: There was mild regurgitation. - Left atrium: The atrium was mildly dilated.  Impressions: - Prosthetic aortic valve was suboptimally visualized. Would recommend TEE if deemed clinically indicated.  PHYSICAL EXAM  Temp:  [98.3 F (36.8 C)-101 F (38.3 C)] 98.4 F (36.9 C) (03/03 1549) Pulse Rate:  [61-88] 73 (03/03 1500) Resp:  [11-24] 15 (03/03 1500) BP: (92-137)/(48-75) 124/57 mmHg (03/03 1500) SpO2:  [92 %-100 %] 100 % (03/03 1500) FiO2 (%):  [40 %] 40 % (03/03 1000)  General - Well nourished, well developed, still intubated, discomfort with ET tube.  Ophthalmologic - fundi not visualized due to incorporation.  Cardiovascular - Regular rate and rhythm.  Neck - supple, no carotid bruits  Neuro - awake alert, following all commands, still intubated, discomfort with ET tube. Blinking to visual threat bilaterally, eyes midline, no gaze preference, PERRL, EOMI, tongue in middle, RUE 4/5 proximal and distal, RLE 5-/5, LUE and LLE 5/5,  left upper extremity decreased light touch and pin point sensation, no ataxia, reflex 1+, no Babinski. Gait not tested.   ASSESSMENT/PLAN Ms. Darlean Warmoth is a 39 y.o. female with history of bicuspid aortic valve s/p replacement x 2 with the last one being mechanical valve, on coumadin but INR was 1.4 last check at home. She was admitted with . Right-sided hemiplegia, aphasia, gaze preference. Symptoms much improved after mechanical thrombectomy.    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 Start Coumadin today, with expectation to reach goal INR 2.5-3.5 in 3-5 days which is also the good timing of left MCA stroke. Once INR goal, will stop Lovenox and change aspirin to 81.  Patient counseled to be compliant with her  antithrombotic medications  Ongoing aggressive stroke risk factor management  Therapy recommendations:  CIR  Disposition:  Pending  Mechanical valve on Coumadin  Subtherapeutic INR, last checked 1.4  This morning INR 1.49  Start Coumadin in today with expect patient to reach goal INR 2.5-3.5 in 3 to 5 days  Continue aspirin  BP management  Home meds:   None Permissive hypertension (OK if <220/120) for 24-48 hours post stroke and then gradually normalized within 5-7 days. Currently on no BP meds  Stable  Hyperlipidemia  Home meds:  None   LDL 80, goal < 70  Add pravastatin 20  Continue statin at discharge  Other Stroke Risk Factors  Obesity,  Body mass index is 34.78 kg/(m^2).   Other Active Problems  Leukocytosis  Other Pertinent History    Hospital day # 1  This patient is critically ill due to left MCA strokes status post mechanical thrombectomy, mechanical valve on Coumadin with subtherapeutic INR and at significant risk of neurological worsening, death form recurrent stroke, enlargement of left MCA infarct, brain edema, cerebral herniation. This patient's care requires constant monitoring of vital signs, hemodynamics, respiratory and cardiac monitoring, review of multiple databases, neurological assessment, discussion with family, other specialists and medical decision making of high complexity. I spent 45 minutes of neurocritical care time in the care of this patient.   Marvel Plan, MD PhD Stroke Neurology 10/09/2014 4:15 PM    To contact Stroke Continuity provider, please refer to [email or web address]. After hours, contact General Neurology

## 2014-10-09 NOTE — Procedures (Signed)
Extubation Procedure Note  Patient Details:   Name: Nichole Black DOB: Jul 29, 1976 MRN: [id number]  Patient extubated to 4LPM. Patient stable throughout. No stridor noted patient able to vocalize. RT will continue to monitor.      Evaluation  O2 sats: stable throughout Complications: No apparent complications Patient did tolerate procedure well. Bilateral Breath Sounds: Clear Suctioning: Airway, Oral Yes  Nichole Black, Nichole Black 10/09/2014, 10:14 AM

## 2014-10-09 NOTE — Progress Notes (Signed)
PULMONARY / CRITICAL CARE MEDICINE   Name: Nichole Black MRN: [id number] DOB: [birth year]    ADMISSION DATE:  10/08/2014 CONSULTATION DATE:  10/08/14  REFERRING MD :  Amada Jupiter   CHIEF COMPLAINT:  Vent management   INITIAL PRESENTATION: 39 yo female with hx mechanical aortic valve repalcement x 2, presented 3/2 to [facility] hospital with R sided weakness and aphasia beginning approx 2am.  She was tx to [facility] and taken directly to IR for attempts at revascularization.  PCCM consulted for post-procedure vent management.   STUDIES and EVENTS CT head ([place]) 3/2>>> mild changes in the insular ribbon and parietal region but still less than 1/3 MCA territory. 3/2 IR revascularization >>> occluded L ICA, L MCA with successful revascularization    SUBJECTIVE:   10/09/14: meets extubation criteria. Sister and brother at bedside   VITAL SIGNS: Temp:  [98.4 F (36.9 C)-101 F (38.3 C)] 99.6 F (37.6 C) (03/03 0800) Pulse Rate:  [60-88] 68 (03/03 0904) Resp:  [11-27] 16 (03/03 0904) BP: (92-146)/(42-82) 123/65 mmHg (03/03 0904) SpO2:  [99 %-100 %] 100 % (03/03 0904) Arterial Line BP: (126-160)/(50-73) 147/67 mmHg (03/02 1500) FiO2 (%):  [40 %-100 %] 40 % (03/03 0904) Weight:  [215 lb 6.2 oz (97.7 kg)] 215 lb 6.2 oz (97.7 kg) (03/02 1300) HEMODYNAMICS:   VENTILATOR SETTINGS: Vent Mode:  [-] PSV;CPAP FiO2 (%):  [40 %-100 %] 40 % Set Rate:  [16 bmp] 16 bmp Vt Set:  [480 mL] 480 mL PEEP:  [5 cmH20] 5 cmH20 Pressure Support:  [5 cmH20] 5 cmH20 Plateau Pressure:  [14 cmH20-20 cmH20] 18 cmH20 INTAKE / OUTPUT:  Intake/Output Summary (Last 24 hours) at 10/09/14 0958 Last data filed at 10/09/14 0900  Gross per 24 hour  Intake 2296.61 ml  Output   2470 ml  Net -173.39 ml    PHYSICAL EXAMINATION: General:  wdwn young female, NAD sedated post IR procedure  Neuro:  RASS 0, opens eyes to voice, follows commands. R hemiparesis HEENT:  Mm dry, no JVD, ETT  Cardiovascular:  s1s2 rrr,  mechanical click  Lungs:  resps even, non labored, scattered rhonchi  Abdomen:  Round, soft, +bs Musculoskeletal:  Warm and dry, no edema   LABS:  PULMONARY  Recent Labs Lab 10/08/14 1350  PHART 7.460*  PCO2ART 28.8*  PO2ART 232.0*  HCO3 20.2  TCO2 21.1  O2SAT 99.7    CBC  Recent Labs Lab 10/08/14 1300 10/09/14 0500  HGB 11.8* 10.3*  HCT 36.6 32.9*  WBC 17.3* 11.2*  PLT 267 231    COAGULATION  Recent Labs Lab 10/09/14 0705  INR 1.49    CARDIAC  No results for input(s): TROPONINI in the last 168 hours. No results for input(s): PROBNP in the last 168 hours.   CHEMISTRY  Recent Labs Lab 10/08/14 1300 10/09/14 0500  NA 136 137  K 3.8 3.7  CL 106 104  CO2 22 23  GLUCOSE 118* 108*  BUN 8 8  CREATININE 0.73 0.64  CALCIUM 8.5 8.3*   Estimated Creatinine Clearance: 112.4 mL/min (by C-G formula based on Cr of 0.64).   LIVER  Recent Labs Lab 10/08/14 1300 10/09/14 0705  AST 56*  --   ALT 65*  --   ALKPHOS 65  --   BILITOT 0.8  --   PROT 6.4  --   ALBUMIN 3.4*  --   INR  --  1.49     INFECTIOUS No results for input(s): LATICACIDVEN, PROCALCITON in the last 168 hours.  ENDOCRINE CBG (last 3)  No results for input(s): GLUCAP in the last 72 hours.       IMAGING x48h Ct Head Wo Contrast  10/08/2014   CLINICAL DATA:  Right-sided weakness. Stroke intervention earlier today.  EXAM: CT HEAD WITHOUT CONTRAST  TECHNIQUE: Contiguous axial images were obtained from the base of the skull through the vertex without intravenous contrast.  COMPARISON:  CT hand intervention films earlier same day  FINDINGS: Post intervention scan shows intravascular contrast. There is staining in the left MCA branches probably indicating the absence of flow. There is loss of gray-white differentiation developing in the left temporal lobe in frontoparietal region consistent with infarction in the left MCA territory. No swelling or mass effect at this time. No  intraparenchymal hematoma. No hydrocephalus. No extra-axial collection.  IMPRESSION: Loss of gray-white differentiation an early swelling in the left temporal lobe and frontoparietal region consistent with left MCA region infarction. Hyperdensity associated with left MCA branches related to contrast persistence falling intervention, likely indicating absence of flow in those vessels.   Electronically Signed   By: Paulina Fusi M.D.   On: 10/08/2014 12:29   Mr Maxine Glenn Head Wo Contrast  10/09/2014   CLINICAL DATA:  RIGHT-sided weakness and aphasia at 2:30 a.m., RIGHT facial droop. Patient was taken for emergent angiography, status post revascularization of occluded LEFT internal carotid artery terminus extending to LEFT middle cerebral artery. Subsequent the without evaluation.  EXAM: MRI HEAD WITHOUT CONTRAST  MRA HEAD WITHOUT CONTRAST  TECHNIQUE: Multiplanar, multiecho pulse sequences of the brain and surrounding structures were obtained without intravenous contrast. Angiographic images of the head were obtained using MRA technique without contrast.  COMPARISON:  CT of the head October 08, 2014  FINDINGS: MRI HEAD FINDINGS  Patchy areas of reduced diffusion within the LEFT insula, subcentimeter area reduced diffusion in LEFT corona radiata, which show low ADC values consistent with acute ischemia. Scattered predominately tiny peripheral foci of susceptibility artifact in the supratentorial brain. No midline shift, mass effect or mass lesions.  Scattered subcentimeter white matter supratentorial T2 hyperintensities. Minimal FLAIR hyperintense signal within the LEFT insula. No abnormal extra-axial fluid collections.  Mild paranasal sinus mucosal thickening with sphenoid air-fluid level. The mastoid air cells are well aerated. Ocular globes and orbital contents are unremarkable. No abnormal sellar expansion. No cerebellar tonsillar ectopia. No suspicious calvarial bone marrow signal.  MRA HEAD FINDINGS  Anterior circulation:  Normal flow related enhancement of the included cervical, petrous, cavernous and supra clinoid internal carotid arteries. Patent anterior communicating artery. 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 Normal flow related enhancement of the anterior and middle cerebral arteries, including more distal segments. Mild luminal regularity of the LEFT M1 segment.  No large vessel occlusion, high-grade stenosis, aneurysm.  Posterior circulation: Codominant vertebral arteries. Basilar artery is patent, with normal flow related enhancement of the main branch vessels. Asymmetric decreased flow related enhancement of the mid to distal RIGHT posterior cerebral artery.  No large vessel occlusion, high-grade stenosis, abnormal luminal irregularity, aneurysm.  IMPRESSION: MRI HEAD: Multifocal acute ischemia within LEFT middle cerebral artery territory.  Greater than expected punctate foci of susceptibility artifact, greater than expected for age. Differential diagnosis includes prior head injury, remote PRES, hypertensive etiology. Recommend followup MRI of the brain within 6 months including gadolinium to verify stability of findings.  MRA HEAD: No large vessel occlusion, mild luminal irregularity of the LEFT M1 segment may reflect sequelae of  recent recanalization, atherosclerosis.  Slight decreased flow related enhancement of the mid to distal RIGHT posterior cerebral artery may reflect atherosclerosis, or even artifact.   Electronically Signed   By: Awilda Metro   On: 10/09/2014 03:54   Mr Brain Wo Contrast  10/09/2014   CLINICAL DATA:  RIGHT-sided weakness and aphasia at 2:30 a.m., RIGHT facial droop. Patient was taken for emergent angiography, status post revascularization of occluded LEFT internal carotid artery terminus extending to LEFT middle cerebral artery. Subsequent the without evaluation.  EXAM: MRI HEAD WITHOUT CONTRAST  MRA HEAD WITHOUT  CONTRAST  TECHNIQUE: Multiplanar, multiecho pulse sequences of the brain and surrounding structures were obtained without intravenous contrast. Angiographic images of the head were obtained using MRA technique without contrast.  COMPARISON:  CT of the head October 08, 2014  FINDINGS: MRI HEAD FINDINGS  Patchy areas of reduced diffusion within the LEFT insula, subcentimeter area reduced diffusion in LEFT corona radiata, which show low ADC values consistent with acute ischemia. Scattered predominately tiny peripheral foci of susceptibility artifact in the supratentorial brain. No midline shift, mass effect or mass lesions.  Scattered subcentimeter white matter supratentorial T2 hyperintensities. Minimal FLAIR hyperintense signal within the LEFT insula. No abnormal extra-axial fluid collections.  Mild paranasal sinus mucosal thickening with sphenoid air-fluid level. The mastoid air cells are well aerated. Ocular globes and orbital contents are unremarkable. No abnormal sellar expansion. No cerebellar tonsillar ectopia. No suspicious calvarial bone marrow signal.  MRA HEAD FINDINGS  Anterior circulation: Normal flow related enhancement of the included cervical, petrous, cavernous and supra clinoid internal carotid arteries. Patent anterior communicating artery. 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 Normal flow related enhancement of the anterior and middle cerebral arteries, including more distal segments. Mild luminal regularity of the LEFT M1 segment.  No large vessel occlusion, high-grade stenosis, aneurysm.  Posterior circulation: Codominant vertebral arteries. Basilar artery is patent, with normal flow related enhancement of the main branch vessels. Asymmetric decreased flow related enhancement of the mid to distal RIGHT posterior cerebral artery.  No large vessel occlusion, high-grade stenosis, abnormal luminal irregularity, aneurysm.   IMPRESSION: MRI HEAD: Multifocal acute ischemia within LEFT middle cerebral artery territory.  Greater than expected punctate foci of susceptibility artifact, greater than expected for age. Differential diagnosis includes prior head injury, remote PRES, hypertensive etiology. Recommend followup MRI of the brain within 6 months including gadolinium to verify stability of findings.  MRA HEAD: No large vessel occlusion, mild luminal irregularity of the LEFT M1 segment may reflect sequelae of recent recanalization, atherosclerosis.  Slight decreased flow related enhancement of the mid to distal RIGHT posterior cerebral artery may reflect atherosclerosis, or even artifact.   Electronically Signed   By: Awilda Metro   On: 10/09/2014 03:54   Dg Chest Port 1 View  10/08/2014   CLINICAL DATA:  Aortic valve replacement with bioprosthetic valve. Stroke.  EXAM: PORTABLE CHEST - 1 VIEW  COMPARISON:  10/08/2014.  FINDINGS: Support apparatus: Endotracheal tube tip is 2.8 cm from the carina. Monitoring leads project over the chest.  Cardiomediastinal Silhouette: Within normal limits, accentuated by the low volumes. Median sternotomy and valve replacement.  Lungs: There is increased density in the RIGHT upper lobe. This is not have an appearance typical for pneumothorax. This is favored to represent a skin fold. Pulmonary parenchymal opacities/airspace disease is less likely. Attention on follow-up is recommended. No pneumothorax.  Effusions:  None.  Other:  Surgical clips  in the RIGHT axilla  IMPRESSION: 1. Endotracheal intubation with the tip 2.8 cm from the carina. 2. Opacity in the RIGHT upper lobe is new compared to recent prior exam, likely due to a skin fold or artifactual. Attention on follow-up recommended.   Electronically Signed   By: Andreas NewportGeoffrey  Lamke M.D.   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 in setting mechanical aortic valve (per  husband pt has had difficulty getting INR therapeutic lately)   - awake  P:   Dc sedation  PULMONARY Vent support post Neuro IR procedure    - meets extubation criteria P:   Extubate   CARDIOVASCULAR Hx aortic valve replacement r/t bicuspid valve  HTN  P:  Hold anticoag for now s/p procedure  neuro to determine timing for resuming anticoagulation  Echo pending   RENAL No active issue  P:   Chem pending   GASTROINTESTINAL No active issue  P:   NPO for now  SLP eval PPI   HEMATOLOGIC Coagulopathy - anticoagulated at baseline in setting mechanic valve  P:  Cbc pending  F/u INR  Hold anticoagulation for now  SCD's    INFECTIOUS No active issue  P:   Monitor WBC, fever curve off abx   ENDOCRINE No active issue    P:   Monitor glucose on chem     FAMILY  - Updates: sister, and brother and patient updated  - Inter-disciplinary family meet or Palliative Care meeting due by: 3/9   PCCM wioll see hhow she is tomorrow and sign off if well     Dr. Kalman ShanMurali Halona Amstutz, M.D., [facility] HealthF.C.C.P Pulmonary and Critical Care Medicine Staff Physician [facility] Pager: [phone], If no answer or between  15:00h - 7:00h: call [phone]  10/09/2014 10:06 AM

## 2014-10-09 NOTE — Progress Notes (Signed)
ANTICOAGULATION CONSULT NOTE - Initial Consult  Pharmacy Consult for coumadin Indication: AVR  Allergies  Allergen Reactions  . Warfarin Sodium Itching and Other (See Comments)    Cannot take Generic, only brand name  . Hydrocodone Itching    Patient Measurements: Height: 5\' 6"  (167.6 cm) Weight: 215 lb 6.2 oz (97.7 kg) IBW/kg (Calculated) : 59.3   Vital Signs: Temp: 98.4 F (36.9 C) (03/03 1549) Temp Source: Oral (03/03 1549) BP: 124/57 mmHg (03/03 1500) Pulse Rate: 73 (03/03 1500)  Labs:  Recent Labs  10/08/14 1300 10/09/14 0500 10/09/14 0705  HGB 11.8* 10.3*  --   HCT 36.6 32.9*  --   PLT 267 231  --   LABPROT  --   --  18.2*  INR  --   --  1.49  CREATININE 0.73 0.64  --     Estimated Creatinine Clearance: 112.4 mL/min (by C-G formula based on Cr of 0.64).   Medical History: Past Medical History  Diagnosis Date  . S/P aortic valve replacement with bioprosthetic valve     Medications:  Prescriptions prior to admission  Medication Sig Dispense Refill Last Dose  . warfarin (COUMADIN) 7.5 MG tablet Take 7.5 mg by mouth every evening. M,W,F take 1 tablet, all other days take 1/2 tablet   10/08/2014 at Unknown time  . aspirin EC 81 MG tablet Take 81 mg by mouth daily.     Marland Kitchen. ibuprofen (ADVIL,MOTRIN) 200 MG tablet Take 200 mg by mouth every 6 (six) hours as needed.       Assessment: 39 yo lady to resume coumadin for AVR.  Her home dose was 7.5 MWF and 3.75 all other days.  INR today is 1.49.  She is s/p IR revascularization for L ICA/ L MCA stroke.   Goal of Therapy:  INR 2.5-3.5 Monitor platelets by anticoagulation protocol: Yes   Plan:  Coumadin 10 mg po today Daily PT/INR  Meryl Hubers Poteet 10/09/2014,4:16 PM

## 2014-10-09 NOTE — Progress Notes (Signed)
Patient ID: Nichole Black, female   DOB: June 04, 1976, 39 y.o.   MRN: [id number]    Referring Physician(s): Stroke Service  Subjective:  Pt awake, still intubated, F/C, moving all fours well  Allergies: Warfarin sodium and Hydrocodone  Medications: Prior to Admission medications   Medication Sig Start Date End Date Taking? Authorizing Provider  aspirin EC 81 MG tablet Take 81 mg by mouth daily.    Historical Provider, MD  ibuprofen (ADVIL,MOTRIN) 200 MG tablet Take 200 mg by mouth every 6 (six) hours as needed.    Historical Provider, MD  warfarin (COUMADIN) 7.5 MG tablet Take 7.5 mg by mouth every evening. 09/01/14   Historical Provider, MD     Vital Signs: BP 123/65 mmHg  Pulse 68  Temp(Src) 99.6 F (37.6 C) (Axillary)  Resp 16  Ht  (1.676 m)  Wt 215 lb 6.2 oz (97.7 kg)  BMI 34.78 kg/m2  SpO2 100%  LMP 10/03/2014 (Exact Date)  Physical Exam intubated, F/C; puncture site rt CFA clean and dry, mildly tender, no hematoma; intact distal pulses; PERRL/EOMI, strength 5/5 left, 4-5/5 right; trace rt drift; sens intact  Imaging: Ct Head Wo Contrast  10/08/2014   CLINICAL DATA:  Right-sided weakness. Stroke intervention earlier today.  EXAM: CT HEAD WITHOUT CONTRAST  TECHNIQUE: Contiguous axial images were obtained from the base of the skull through the vertex without intravenous contrast.  COMPARISON:  CT hand intervention films earlier same day  FINDINGS: Post intervention scan shows intravascular contrast. There is staining in the left MCA branches probably indicating the absence of flow. There is loss of gray-white differentiation developing in the left temporal lobe in frontoparietal region consistent with infarction in the left MCA territory. No swelling or mass effect at this time. No intraparenchymal hematoma. No hydrocephalus. No extra-axial collection.  IMPRESSION: Loss of gray-white differentiation an early swelling in the left temporal lobe and frontoparietal region consistent  with left MCA region infarction. Hyperdensity associated with left MCA branches related to contrast persistence falling intervention, likely indicating absence of flow in those vessels.   Electronically Signed   By: Paulina Fusi M.D.   On: 10/08/2014 12:29   Mr Maxine Glenn Head Wo Contrast  10/09/2014   CLINICAL DATA:  RIGHT-sided weakness and aphasia at 2:30 a.m., RIGHT facial droop. Patient was taken for emergent angiography, status post revascularization of occluded LEFT internal carotid artery terminus extending to LEFT middle cerebral artery. Subsequent the without evaluation.  EXAM: MRI HEAD WITHOUT CONTRAST  MRA HEAD WITHOUT CONTRAST  TECHNIQUE: Multiplanar, multiecho pulse sequences of the brain and surrounding structures were obtained without intravenous contrast. Angiographic images of the head were obtained using MRA technique without contrast.  COMPARISON:  CT of the head October 08, 2014  FINDINGS: MRI HEAD FINDINGS  Patchy areas of reduced diffusion within the LEFT insula, subcentimeter area reduced diffusion in LEFT corona radiata, which show low ADC values consistent with acute ischemia. Scattered predominately tiny peripheral foci of susceptibility artifact in the supratentorial brain. No midline shift, mass effect or mass lesions.  Scattered subcentimeter white matter supratentorial T2 hyperintensities. Minimal FLAIR hyperintense signal within the LEFT insula. No abnormal extra-axial fluid collections.  Mild paranasal sinus mucosal thickening with sphenoid air-fluid level. The mastoid air cells are well aerated. Ocular globes and orbital contents are unremarkable. No abnormal sellar expansion. No cerebellar tonsillar ectopia. No suspicious calvarial bone marrow signal.  MRA HEAD FINDINGS  Anterior circulation: Normal flow related enhancement of the included cervical, petrous, cavernous and supra  clinoid internal carotid arteries. Patent anterior communicating artery. 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 Normal flow related enhancement of the anterior and middle cerebral arteries, including more distal segments. Mild luminal regularity of the LEFT M1 segment.  No large vessel occlusion, high-grade stenosis, aneurysm.  Posterior circulation: Codominant vertebral arteries. Basilar artery is patent, with normal flow related enhancement of the main branch vessels. Asymmetric decreased flow related enhancement of the mid to distal RIGHT posterior cerebral artery.  No large vessel occlusion, high-grade stenosis, abnormal luminal irregularity, aneurysm.  IMPRESSION: MRI HEAD: Multifocal acute ischemia within LEFT middle cerebral artery territory.  Greater than expected punctate foci of susceptibility artifact, greater than expected for age. Differential diagnosis includes prior head injury, remote PRES, hypertensive etiology. Recommend followup MRI of the brain within 6 months including gadolinium to verify stability of findings.  MRA HEAD: No large vessel occlusion, mild luminal irregularity of the LEFT M1 segment may reflect sequelae of recent recanalization, atherosclerosis.  Slight decreased flow related enhancement of the mid to distal RIGHT posterior cerebral artery may reflect atherosclerosis, or even artifact.   Electronically Signed   By: Awilda Metroourtnay  Bloomer   On: 10/09/2014 03:54   Mr Brain Wo Contrast  10/09/2014   CLINICAL DATA:  RIGHT-sided weakness and aphasia at 2:30 a.m., RIGHT facial droop. Patient was taken for emergent angiography, status post revascularization of occluded LEFT internal carotid artery terminus extending to LEFT middle cerebral artery. Subsequent the without evaluation.  EXAM: MRI HEAD WITHOUT CONTRAST  MRA HEAD WITHOUT CONTRAST  TECHNIQUE: Multiplanar, multiecho pulse sequences of the brain and surrounding structures were obtained without intravenous contrast. Angiographic images of the head were obtained using  MRA technique without contrast.  COMPARISON:  CT of the head October 08, 2014  FINDINGS: MRI HEAD FINDINGS  Patchy areas of reduced diffusion within the LEFT insula, subcentimeter area reduced diffusion in LEFT corona radiata, which show low ADC values consistent with acute ischemia. Scattered predominately tiny peripheral foci of susceptibility artifact in the supratentorial brain. No midline shift, mass effect or mass lesions.  Scattered subcentimeter white matter supratentorial T2 hyperintensities. Minimal FLAIR hyperintense signal within the LEFT insula. No abnormal extra-axial fluid collections.  Mild paranasal sinus mucosal thickening with sphenoid air-fluid level. The mastoid air cells are well aerated. Ocular globes and orbital contents are unremarkable. No abnormal sellar expansion. No cerebellar tonsillar ectopia. No suspicious calvarial bone marrow signal.  MRA HEAD FINDINGS  Anterior circulation: Normal flow related enhancement of the included cervical, petrous, cavernous and supra clinoid internal carotid arteries. Patent anterior communicating artery. 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 Normal flow related enhancement of the anterior and middle cerebral arteries, including more distal segments. Mild luminal regularity of the LEFT M1 segment.  No large vessel occlusion, high-grade stenosis, aneurysm.  Posterior circulation: Codominant vertebral arteries. Basilar artery is patent, with normal flow related enhancement of the main branch vessels. Asymmetric decreased flow related enhancement of the mid to distal RIGHT posterior cerebral artery.  No large vessel occlusion, high-grade stenosis, abnormal luminal irregularity, aneurysm.  IMPRESSION: MRI HEAD: Multifocal acute ischemia within LEFT middle cerebral artery territory.  Greater than expected punctate foci of susceptibility artifact, greater than expected for age. Differential  diagnosis includes prior head injury, remote PRES, hypertensive etiology. Recommend followup MRI of the brain within 6 months including gadolinium to verify stability of findings.  MRA  HEAD: No large vessel occlusion, mild luminal irregularity of the LEFT M1 segment may reflect sequelae of recent recanalization, atherosclerosis.  Slight decreased flow related enhancement of the mid to distal RIGHT posterior cerebral artery may reflect atherosclerosis, or even artifact.   Electronically Signed   By: Awilda Metro   On: 10/09/2014 03:54   Dg Chest Port 1 View  10/08/2014   CLINICAL DATA:  Aortic valve replacement with bioprosthetic valve. Stroke.  EXAM: PORTABLE CHEST - 1 VIEW  COMPARISON:  10/08/2014.  FINDINGS: Support apparatus: Endotracheal tube tip is 2.8 cm from the carina. Monitoring leads project over the chest.  Cardiomediastinal Silhouette: Within normal limits, accentuated by the low volumes. Median sternotomy and valve replacement.  Lungs: There is increased density in the RIGHT upper lobe. This is not have an appearance typical for pneumothorax. This is favored to represent a skin fold. Pulmonary parenchymal opacities/airspace disease is less likely. Attention on follow-up is recommended. No pneumothorax.  Effusions:  None.  Other:  Surgical clips in the RIGHT axilla  IMPRESSION: 1. Endotracheal intubation with the tip 2.8 cm from the carina. 2. Opacity in the RIGHT upper lobe is new compared to recent prior exam, likely due to a skin fold or artifactual. Attention on follow-up recommended.   Electronically Signed   By: Andreas Newport M.D.   On: 10/08/2014 16:23    Labs:  CBC:  Recent Labs  10/08/14 1300 10/09/14 0500  WBC 17.3* 11.2*  HGB 11.8* 10.3*  HCT 36.6 32.9*  PLT 267 231    COAGS:  Recent Labs  10/09/14 0705  INR 1.49    BMP:  Recent Labs  10/08/14 1300 10/09/14 0500  NA 136 137  K 3.8 3.7  CL 106 104  CO2 22 23  GLUCOSE 118* 108*  BUN 8 8  CALCIUM 8.5  8.3*  CREATININE 0.73 0.64  GFRNONAA >90 >90  GFRAA >90 >90    LIVER FUNCTION TESTS:  Recent Labs  10/08/14 1300  BILITOT 0.8  AST 56*  ALT 65*  ALKPHOS 65  PROT 6.4  ALBUMIN 3.4*    Assessment and Plan: S/p left ICA/MCA CVA with complete revascularizatiom of occluded LT ICA terminus ,Lt MCA with x3 passes of Solitaire FR 4mm x 40 mm retrieval ,and 7 mg of IA INtegrelin superselectivelly ; pt stable/improved; MRA/MRI head reviewed; cont current tx; possible extubation. Other plans as per neuro/CCM.     Signed: Sharmon Cheramie,D KEVIN3/10/2014, 9:59 AM   I spent a total of 15 minutes  in face to face in clinical consultation/evaluation, greater than 50% of which was counseling/coordinating care for cerebral arteriogram with revascularization

## 2014-10-10 ENCOUNTER — Encounter (HOSPITAL_COMMUNITY): Payer: Self-pay | Admitting: *Deleted

## 2014-10-10 LAB — PROTIME-INR
INR: 1.49 (ref 0.00–1.49)
Prothrombin Time: 18.2 seconds — ABNORMAL HIGH (ref 11.6–15.2)

## 2014-10-10 LAB — BASIC METABOLIC PANEL
Anion gap: 7 (ref 5–15)
BUN: 10 mg/dL (ref 6–23)
CHLORIDE: 107 mmol/L (ref 96–112)
CO2: 25 mmol/L (ref 19–32)
CREATININE: 0.8 mg/dL (ref 0.50–1.10)
Calcium: 8.6 mg/dL (ref 8.4–10.5)
GFR calc Af Amer: >90 mL/min (ref >90)
Glucose, Bld: 99 mg/dL (ref 70–99)
Potassium: 3.9 mmol/L (ref 3.5–5.1)
Sodium: 139 mmol/L (ref 135–145)

## 2014-10-10 LAB — HEMOGLOBIN A1C
Hgb A1c MFr Bld: 5.7 % — ABNORMAL HIGH (ref 4.8–5.6)
MEAN PLASMA GLUCOSE: 117 mg/dL

## 2014-10-10 LAB — CBC
HEMATOCRIT: 32.7 % — AB (ref 36.0–46.0)
Hemoglobin: 10.2 g/dL — ABNORMAL LOW (ref 12.0–15.0)
MCH: 25.7 pg — ABNORMAL LOW (ref 26.0–34.0)
MCHC: 31.2 g/dL (ref 30.0–36.0)
MCV: 82.4 fL (ref 78.0–100.0)
PLATELETS: 236 10*3/uL (ref 150–400)
RBC: 3.97 MIL/uL (ref 3.87–5.11)
RDW: 15.8 % — AB (ref 11.5–15.5)
WBC: 10.4 10*3/uL (ref 4.0–10.5)

## 2014-10-10 MED ORDER — WARFARIN SODIUM 10 MG PO TABS
10.0000 mg | ORAL_TABLET | Freq: Once | ORAL | Status: AC
  Administered 2014-10-10: 10 mg via ORAL
  Filled 2014-10-10: qty 1

## 2014-10-10 MED ORDER — ACETAMINOPHEN 325 MG PO TABS: 650.0000 mg | ORAL_TABLET | Freq: Four times a day (QID) | ORAL | Status: DC | PRN

## 2014-10-10 NOTE — Progress Notes (Addendum)
ANTICOAGULATION CONSULT NOTE - Initial Consult  Pharmacy Consult for coumadin Indication: AVR  Allergies  Allergen Reactions  . Warfarin Sodium Itching and Other (See Comments)    Cannot take Generic, only brand name  . Hydrocodone Itching    Patient Measurements: Height: 5\' 6"  (167.6 cm) Weight: 215 lb 6.2 oz (97.7 kg) IBW/kg (Calculated) : 59.3   Vital Signs: Temp: 98 F (36.7 C) (03/04 0758) Temp Source: Oral (03/04 0758) BP: 105/56 mmHg (03/04 0900) Pulse Rate: 84 (03/04 0900)  Labs:  Recent Labs  10/08/14 1300 10/09/14 0500 10/09/14 0705 10/10/14 0230  HGB 11.8* 10.3*  --  10.2*  HCT 36.6 32.9*  --  32.7*  PLT 267 231  --  236  LABPROT  --   --  18.2* 18.2*  INR  --   --  1.49 1.49  CREATININE 0.73 0.64  --  0.80    Estimated Creatinine Clearance: 112.4 mL/min (by C-G formula based on Cr of 0.8).   Medical History: Past Medical History  Diagnosis Date  . S/P aortic valve replacement with bioprosthetic valve     Medications:  Prescriptions prior to admission  Medication Sig Dispense Refill Last Dose  . warfarin (COUMADIN) 7.5 MG tablet Take 7.5 mg by mouth every evening. M,W,F take 1 tablet, all other days take 1/2 tablet   10/08/2014 at Unknown time  . aspirin EC 81 MG tablet Take 81 mg by mouth daily.     Marland Kitchen. ibuprofen (ADVIL,MOTRIN) 200 MG tablet Take 200 mg by mouth every 6 (six) hours as needed.       Assessment: 39 yo female to resume coumadin for AVR.  Her home dose was 7.5 MWF and 3.75 all other days.  INR today is 1.49.  She is s/p IR revascularization for L ICA/ L MCA stroke. Pt is also on Aspirin 325 mg daily and Lovenox 40 mg daily -- at increased bleeding risk.  Goal of Therapy:  INR 2.5-3.5 Monitor platelets by anticoagulation protocol: Yes   Plan:  Coumadin 10 mg po x1 Daily PT/INR Dc Lovenox when INR therapeutic   Agapito GamesAlison Onica Davidovich, PharmD, BCPS Clinical Pharmacist Pager: [phone] 10/10/2014 9:46 AM

## 2014-10-10 NOTE — Progress Notes (Signed)
Patient arrived to 4N01. VSS, A&Ox4, see assessment flowsheet. Patient is now resting in bed, call bell within patient's reach. Patient educated about fall prevention policy, patient vocalized understanding and stated she will contact staff prior to ambulation. Will continue to monitor patient closely.

## 2014-10-10 NOTE — Progress Notes (Signed)
Rehab admissions - I met with pt in follow up to rehab MD consult and shared that rehab MD is recommending outpatient therapy. Per Dr. Letta Pate, "Already at [facility], rec oupt rehab at [facility]."  Pt had no concerns with this recommendation. I will now sign off pt's case.   Thanks.  Nanetta Batty, PT Rehabilitation Admissions Coordinator [phone]

## 2014-10-10 NOTE — Progress Notes (Signed)
STROKE TEAM PROGRESS NOTE   HISTORY OF PRESENT ILLNESS Nichole Black is a 39 y.o. female who presented to [place] this am with right sided weakness and aphasia. Initially, she was still able to speak and told the ER physician that symptoms started about 2:30am. She was mild at first, however and got her children ready for school. Her husband then noticed her facial droop and took her to [place] ER where she continued to worsen. She was transferred to [facility] and taken directly to IR.   Her head CT shows some mild changes in the insular ribbon and parietal region but still less than 1/3 MCA territory.  Per family, she has had trouble getting her INR within range lately.  LKW: 2:30am tpa given?: no, out of window.  NIHSS: 21  SUBJECTIVE (INTERVAL HISTORY) No family is at the bedside. She is awake alert, following commands, extubated yesterday, tolerating very well. Her speech and right UE weakness resolved and only complain some left facial and left hand numbness. Started coumadin yesterday and today INR 1.49.  OBJECTIVE Temp:  [98 F (36.7 C)-99.1 F (37.3 C)] 98.3 F (36.8 C) (03/04 1208) Pulse Rate:  [44-84] 67 (03/04 1000) Cardiac Rhythm:  [-] Normal sinus rhythm (03/04 0800) Resp:  [15-22] 17 (03/04 1000) BP: (88-132)/(42-81) 132/72 mmHg (03/04 1000) SpO2:  [90 %-100 %] 99 % (03/04 1000)  No results for input(s): GLUCAP in the last 168 hours.  Recent Labs Lab 10/08/14 1300 10/09/14 0500 10/10/14 0230  NA 136 137 139  K 3.8 3.7 3.9  CL 106 104 107  CO2 GLUCOSE 118* 108* 99  BUN CREATININE 0.73 0.64 0.80  CALCIUM 8.5 8.3* 8.6    Recent Labs Lab 10/08/14 1300  AST 56*  ALT 65*  ALKPHOS 65  BILITOT 0.8  PROT 6.4  ALBUMIN 3.4*    Recent Labs Lab 10/08/14 1300 10/09/14 0500 10/10/14 0230  WBC 17.3* 11.2* 10.4  NEUTROABS  --  8.3*  --   HGB 11.8* 10.3* 10.2*  HCT 36.6 32.9* 32.7*  MCV 81.9 81.8 82.4  PLT 267 231 236   No results for input(s):  CKTOTAL, CKMB, CKMBINDEX, TROPONINI in the last 168 hours.  Recent Labs  10/09/14 0705 10/10/14 0230  LABPROT 18.2* 18.2*  INR 1.49 1.49   No results for input(s): COLORURINE, LABSPEC, PHURINE, GLUCOSEU, HGBUR, BILIRUBINUR, KETONESUR, PROTEINUR, UROBILINOGEN, NITRITE, LEUKOCYTESUR in the last 72 hours.  Invalid input(s): APPERANCEUR     Component Value Date/Time   CHOL 148 10/09/2014 0500   TRIG 166* 10/09/2014 0500   HDL 35* 10/09/2014 0500   CHOLHDL 4.2 10/09/2014 0500   VLDL 33 10/09/2014 0500   LDLCALC 80 10/09/2014 0500   Lab Results  Component Value Date   HGBA1C 5.7* 10/09/2014   No results found for: LABOPIA, COCAINSCRNUR, LABBENZ, AMPHETMU, THCU, LABBARB  No results for input(s): ETH in the last 168 hours.  I have personally reviewed the radiological images below and agree with the radiology interpretations.  Ct Head Wo Contrast  10/08/2014    IMPRESSION: Loss of gray-white differentiation an early swelling in the left temporal lobe and frontoparietal region consistent with left MCA region infarction. Hyperdensity associated with left MCA branches related to contrast persistence falling intervention, likely indicating absence of flow in those vessels.      Mr Maxine Glenn Head Wo Contrast  10/09/2014   IMPRESSION: MRI HEAD: Multifocal acute ischemia within LEFT middle cerebral artery territory.  Greater than  expected punctate foci of susceptibility artifact, greater than expected for age. Differential diagnosis includes prior head injury, remote PRES, hypertensive etiology. Recommend followup MRI of the brain within 6 months including gadolinium to verify stability of findings.  MRA HEAD: No large vessel occlusion, mild luminal irregularity of the LEFT M1 segment may reflect sequelae of recent recanalization, atherosclerosis.  Slight decreased flow related enhancement of the mid to distal RIGHT posterior cerebral artery may reflect atherosclerosis, or even artifact.     Dg Chest  Port 1 View  10/08/2014   IMPRESSION: 1. Endotracheal intubation with the tip 2.8 cm from the carina. 2. Opacity in the RIGHT upper lobe is new compared to recent prior exam, likely due to a skin fold or artifactual. Attention on follow-up recommended.      Carotid Doppler  RT ICA/CCA ratio: 1.06  LT ICA/CCA ratio: 1.72  1-39% RICA stenosis.  40-69% LICA stenosis.  Bilateral antegrade vertebral arterial flow.  2D Echocardiogram  - Left ventricle: The cavity size was normal. There was mild concentric hypertrophy. Systolic function was vigorous. The estimated ejection fraction was in the range of 65% to 70%. Wall motion was normal; there were no regional wall motion abnormalities. Left ventricular diastolic function parameters were normal. - Aortic valve: Mechanical aortic valve present. Appears to exhibit reasonably normal function. Peak velocity 3.45 m/s. There was trivial regurgitation. Mean gradient (S): 27 mm Hg. - Mitral valve: There was mild regurgitation. - Left atrium: The atrium was mildly dilated.  Impressions: - Prosthetic aortic valve was suboptimally visualized. Would recommend TEE if deemed clinically indicated.  PHYSICAL EXAM  Temp:  [98 F (36.7 C)-99.1 F (37.3 C)] 98.3 F (36.8 C) (03/04 1208) Pulse Rate:  [44-84] 67 (03/04 1000) Resp:  [15-22] 17 (03/04 1000) BP: (88-132)/(42-81) 132/72 mmHg (03/04 1000) SpO2:  [90 %-100 %] 99 % (03/04 1000)  General - Well nourished, well developed, in no apparent distress.  Ophthalmologic - Sharp disc margins OU.  Cardiovascular - Regular rate and rhythm with no murmur.  Mental Status -  Level of arousal and orientation to time, place, and person were intact. Language including expression, naming, repetition, comprehension was assessed and found intact.  Cranial Nerves II - XII - II - Visual  field intact OU. III, IV, VI - Extraocular movements intact. V - Facial sensation decreased on the right. VII - Facial movement intact bilaterally. VIII - Hearing & vestibular intact bilaterally. X - Palate elevates symmetrically. XI - Chin turning & shoulder shrug intact bilaterally. XII - Tongue protrusion intact.  Motor Strength - The patient's strength was normal in all extremities and pronator drift was absent.  Bulk was normal and fasciculations were absent.   Motor Tone - Muscle tone was assessed at the neck and appendages and was normal.  Reflexes - The patient's reflexes were normal in all extremities and she had no pathological reflexes.  Sensory - Light touch, temperature/pinprick were assessed and were slightly decreased on the right.    Coordination - The patient had normal movements in the hands and feet with no ataxia or dysmetria.  Tremor was absent.  Gait and Station - deferred due to safety concerns.   ASSESSMENT/PLAN [name] is a 39 y.o. female with history of bicuspid aortic valve s/p replacement x 2 with the last one being mechanical valve, on coumadin but INR was 1.4 last check at home. She was admitted with . Right-sided hemiplegia, aphasia, gaze preference. Symptoms much improved after mechanical thrombectomy. Resume coumadin.  Stroke:  Dominant left MCA territory infarct embolic secondary to mechanical wall with subtherapeutic INR. S/p mechanical thrombectomy and symptoms nearly resolved.  MRI  left MCA territory infarct, much smaller than predicted from previous CT  MRA  large and patent intracranial vessels, with only irregularity of left M1 status post of mechanical thrombectomy  Carotid Doppler  left ICA 40-69% stenosis  2D Echo  unremarkable  LDL 80  HgbA1c 5.7  Lovenox for VTE prophylaxis  Diet regular   aspirin 81 mg orally every day and warfarin prior to admission, now on aspirin 325 mg orally every day and warfarin. Start Coumadin  10/09/14, with expectation to reach goal INR 2.5-3.5 in 3-5 days which is also the good timing for left MCA stroke. Once INR goal, will stop Lovenox for DVT prophylaxis and change aspirin to 81mg . Pt was on home ASA 81 as per her cardiology along with coumadin.   Patient counseled to be compliant with her antithrombotic medications  Ongoing aggressive stroke risk factor management  Therapy recommendations:  Out pt PT  Disposition:  Discharge in am  Mechanical valve on Coumadin  Subtherapeutic INR, last checked 1.4 at home  This morning INR 1.49  Start Coumadin 10/09/14 with expect patient to reach goal INR 2.5-3.5 in 3 to 5 days  Continue aspirin 325 mg now , but once INR on the goal, change back to baby ASA.  BP management  Home meds:   None Permissive hypertension (OK if <220/120) for 24-48 hours post stroke and then gradually normalized within 5-7 days. Currently on no BP meds  Stable  Hyperlipidemia  Home meds:  None   LDL 80, goal < 70  Add pravastatin 20  Continue statin at discharge  Other Stroke Risk Factors  Obesity, Body mass index is 34.78 kg/(m^2).   Other Active Problems  Leukocytosis - resolved  Other Pertinent History    Hospital day # 2   Marvel PlanJindong Jadia Capers, MD PhD Stroke Neurology 10/10/2014 12:13 PM    To contact Stroke Continuity provider, please refer to [email or web address]. After hours, contact General Neurology

## 2014-10-10 NOTE — Progress Notes (Addendum)
Referring Physician(s): Neuro  Subjective: Patient has been extubated, moving all four extremities. She c/o left frontal/face headache and aching last night, denies any HA today.   Allergies: Warfarin sodium and Hydrocodone  Medications: Prior to Admission medications   Medication Sig Start Date End Date Taking? Authorizing Provider  warfarin (COUMADIN) 7.5 MG tablet Take 7.5 mg by mouth every evening. M,W,F take 1 tablet, all other days take 1/2 tablet 09/01/14  Yes Historical Provider, MD  aspirin EC 81 MG tablet Take 81 mg by mouth daily.    Historical Provider, MD  ibuprofen (ADVIL,MOTRIN) 200 MG tablet Take 200 mg by mouth every 6 (six) hours as needed.    Historical Provider, MD   Vital Signs: BP 104/57 mmHg  Pulse 67  Temp(Src) 98 F (36.7 C) (Oral)  Resp 17  Ht  (1.676 m)  Wt 215 lb 6.2 oz (97.7 kg)  BMI 34.78 kg/m2  SpO2 95%  LMP 10/03/2014 (Exact Date)  Physical Exam General: A&Ox3, NAD, sitting up in bed, Extubated Neuro: Speech clear, smile symmetrical, tongue midline, equal strength upper and lower extremities, RCFA site soft, NT, no signs of bleeding/hematoma, DP intact B/L  Imaging: Ct Head Wo Contrast  10/08/2014   CLINICAL DATA:  Right-sided weakness. Stroke intervention earlier today.  EXAM: CT HEAD WITHOUT CONTRAST  TECHNIQUE: Contiguous axial images were obtained from the base of the skull through the vertex without intravenous contrast.  COMPARISON:  CT hand intervention films earlier same day  FINDINGS: Post intervention scan shows intravascular contrast. There is staining in the left MCA branches probably indicating the absence of flow. There is loss of gray-white differentiation developing in the left temporal lobe in frontoparietal region consistent with infarction in the left MCA territory. No swelling or mass effect at this time. No intraparenchymal hematoma. No hydrocephalus. No extra-axial collection.  IMPRESSION: Loss of gray-white  differentiation an early swelling in the left temporal lobe and frontoparietal region consistent with left MCA region infarction. Hyperdensity associated with left MCA branches related to contrast persistence falling intervention, likely indicating absence of flow in those vessels.   Electronically Signed   By: Paulina Fusi M.D.   On: 10/08/2014 12:29   Mr Maxine Glenn Head Wo Contrast  10/09/2014   CLINICAL DATA:  RIGHT-sided weakness and aphasia at 2:30 a.m., RIGHT facial droop. Patient was taken for emergent angiography, status post revascularization of occluded LEFT internal carotid artery terminus extending to LEFT middle cerebral artery. Subsequent the without evaluation.  EXAM: MRI HEAD WITHOUT CONTRAST  MRA HEAD WITHOUT CONTRAST  TECHNIQUE: Multiplanar, multiecho pulse sequences of the brain and surrounding structures were obtained without intravenous contrast. Angiographic images of the head were obtained using MRA technique without contrast.  COMPARISON:  CT of the head October 08, 2014  FINDINGS: MRI HEAD FINDINGS  Patchy areas of reduced diffusion within the LEFT insula, subcentimeter area reduced diffusion in LEFT corona radiata, which show low ADC values consistent with acute ischemia. Scattered predominately tiny peripheral foci of susceptibility artifact in the supratentorial brain. No midline shift, mass effect or mass lesions.  Scattered subcentimeter white matter supratentorial T2 hyperintensities. Minimal FLAIR hyperintense signal within the LEFT insula. No abnormal extra-axial fluid collections.  Mild paranasal sinus mucosal thickening with sphenoid air-fluid level. The mastoid air cells are well aerated. Ocular globes and orbital contents are unremarkable. No abnormal sellar expansion. No cerebellar tonsillar ectopia. No suspicious calvarial bone marrow signal.  MRA HEAD FINDINGS  Anterior circulation: Normal flow related enhancement of  the included cervical, petrous, cavernous and supra clinoid internal  carotid arteries. Patent anterior communicating artery. 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 Normal flow related enhancement of the anterior and middle cerebral arteries, including more distal segments. Mild luminal regularity of the LEFT M1 segment.  No large vessel occlusion, high-grade stenosis, aneurysm.  Posterior circulation: Codominant vertebral arteries. Basilar artery is patent, with normal flow related enhancement of the main branch vessels. Asymmetric decreased flow related enhancement of the mid to distal RIGHT posterior cerebral artery.  No large vessel occlusion, high-grade stenosis, abnormal luminal irregularity, aneurysm.  IMPRESSION: MRI HEAD: Multifocal acute ischemia within LEFT middle cerebral artery territory.  Greater than expected punctate foci of susceptibility artifact, greater than expected for age. Differential diagnosis includes prior head injury, remote PRES, hypertensive etiology. Recommend followup MRI of the brain within 6 months including gadolinium to verify stability of findings.  MRA HEAD: No large vessel occlusion, mild luminal irregularity of the LEFT M1 segment may reflect sequelae of recent recanalization, atherosclerosis.  Slight decreased flow related enhancement of the mid to distal RIGHT posterior cerebral artery may reflect atherosclerosis, or even artifact.   Electronically Signed   By: Awilda Metro   On: 10/09/2014 03:54   Mr Brain Wo Contrast  10/09/2014   CLINICAL DATA:  RIGHT-sided weakness and aphasia at 2:30 a.m., RIGHT facial droop. Patient was taken for emergent angiography, status post revascularization of occluded LEFT internal carotid artery terminus extending to LEFT middle cerebral artery. Subsequent the without evaluation.  EXAM: MRI HEAD WITHOUT CONTRAST  MRA HEAD WITHOUT CONTRAST  TECHNIQUE: Multiplanar, multiecho pulse sequences of the brain and surrounding structures were  obtained without intravenous contrast. Angiographic images of the head were obtained using MRA technique without contrast.  COMPARISON:  CT of the head October 08, 2014  FINDINGS: MRI HEAD FINDINGS  Patchy areas of reduced diffusion within the LEFT insula, subcentimeter area reduced diffusion in LEFT corona radiata, which show low ADC values consistent with acute ischemia. Scattered predominately tiny peripheral foci of susceptibility artifact in the supratentorial brain. No midline shift, mass effect or mass lesions.  Scattered subcentimeter white matter supratentorial T2 hyperintensities. Minimal FLAIR hyperintense signal within the LEFT insula. No abnormal extra-axial fluid collections.  Mild paranasal sinus mucosal thickening with sphenoid air-fluid level. The mastoid air cells are well aerated. Ocular globes and orbital contents are unremarkable. No abnormal sellar expansion. No cerebellar tonsillar ectopia. No suspicious calvarial bone marrow signal.  MRA HEAD FINDINGS  Anterior circulation: Normal flow related enhancement of the included cervical, petrous, cavernous and supra clinoid internal carotid arteries. Patent anterior communicating artery. 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 Normal flow related enhancement of the anterior and middle cerebral arteries, including more distal segments. Mild luminal regularity of the LEFT M1 segment.  No large vessel occlusion, high-grade stenosis, aneurysm.  Posterior circulation: Codominant vertebral arteries. Basilar artery is patent, with normal flow related enhancement of the main branch vessels. Asymmetric decreased flow related enhancement of the mid to distal RIGHT posterior cerebral artery.  No large vessel occlusion, high-grade stenosis, abnormal luminal irregularity, aneurysm.  IMPRESSION: MRI HEAD: Multifocal acute ischemia within LEFT middle cerebral artery territory.  Greater than  expected punctate foci of susceptibility artifact, greater than expected for age. Differential diagnosis includes prior head injury, remote PRES, hypertensive etiology. Recommend followup MRI of the brain within 6 months including gadolinium  to verify stability of findings.  MRA HEAD: No large vessel occlusion, mild luminal irregularity of the LEFT M1 segment may reflect sequelae of recent recanalization, atherosclerosis.  Slight decreased flow related enhancement of the mid to distal RIGHT posterior cerebral artery may reflect atherosclerosis, or even artifact.   Electronically Signed   By: Awilda Metroourtnay  Bloomer   On: 10/09/2014 03:54   Dg Chest Port 1 View  10/08/2014   CLINICAL DATA:  Aortic valve replacement with bioprosthetic valve. Stroke.  EXAM: PORTABLE CHEST - 1 VIEW  COMPARISON:  10/08/2014.  FINDINGS: Support apparatus: Endotracheal tube tip is 2.8 cm from the carina. Monitoring leads project over the chest.  Cardiomediastinal Silhouette: Within normal limits, accentuated by the low volumes. Median sternotomy and valve replacement.  Lungs: There is increased density in the RIGHT upper lobe. This is not have an appearance typical for pneumothorax. This is favored to represent a skin fold. Pulmonary parenchymal opacities/airspace disease is less likely. Attention on follow-up is recommended. No pneumothorax.  Effusions:  None.  Other:  Surgical clips in the RIGHT axilla  IMPRESSION: 1. Endotracheal intubation with the tip 2.8 cm from the carina. 2. Opacity in the RIGHT upper lobe is new compared to recent prior exam, likely due to a skin fold or artifactual. Attention on follow-up recommended.   Electronically Signed   By: Andreas NewportGeoffrey  Lamke M.D.   On: 10/08/2014 16:23    Labs:  CBC:  Recent Labs  10/08/14 1300 10/09/14 0500 10/10/14 0230  WBC 17.3* 11.2* 10.4  HGB 11.8* 10.3* 10.2*  HCT 36.6 32.9* 32.7*  PLT 267 231 236    COAGS:  Recent Labs  10/09/14 0705 10/10/14 0230  INR 1.49 1.49      BMP:  Recent Labs  10/08/14 1300 10/09/14 0500 10/10/14 0230  NA 136 137 139  K 3.8 3.7 3.9  CL 106 104 107  CO2 22 23 25   GLUCOSE 118* 108* 99  BUN 8 8 10   CALCIUM 8.5 8.3* 8.6  CREATININE 0.73 0.64 0.80  GFRNONAA >90 >90 >90  GFRAA >90 >90 >90    LIVER FUNCTION TESTS:  Recent Labs  10/08/14 1300  BILITOT 0.8  AST 56*  ALT 65*  ALKPHOS 65  PROT 6.4  ALBUMIN 3.4*    Assessment and Plan: CVA, Right sided weakness and aphasia Mechanical AV, subtherapeutic on admission now on coumadin and lovenox, INR 1.49 S/p Left ICA/MCA complete revascularization with x3 passes of Solitaire FR 4mm x 40 mm retrieval and 7 mg of IA Integrelin  Extubated, moving all four extremities with clear speech Left frontal HA yesterday, no complaints today, BP wnl Will report to Dr. Corliss Skainseveshwar  Plans per Neuro/CCM   Signed: Pattricia BossMORGAN, Helmut Hennon D 10/10/2014, 9:06 AM   I spent a total of 15 Minutes in face to face in clinical consultation/evaluation, greater than 50% of which was counseling/coordinating care for stroke s/p revascularization.

## 2014-10-10 NOTE — Progress Notes (Signed)
Physical Therapy Treatment Patient Details Name: Nichole Black MRN: [id number] DOB: Mar 09, 1976 Today's Date: 10/10/2014    History of Present Illness Nichole AcresKelly Longhi is a 39 y.o. female who presented to [place] this am with right sided weakness and aphasia, [facility]--found to have occluded L ICA, L MCA with successful revascularization, vented and sedated. 10/09/14 extubated PMHx:  mechanical aortic valve repalcement x 2    PT Comments    Pt much improved today and able to perform stairs gait.  Pt moves slowly and very cautiously, but no physical A needed.  Pt does indicate R side feeling tired this pm after activity this am.  Discussed pacing activities at home and performance of stairs at home.  Will continue to follow.    Follow Up Recommendations  Outpatient PT;Supervision - Intermittent     Equipment Recommendations  None recommended by PT    Recommendations for Other Services       Precautions / Restrictions Precautions Precautions: None Restrictions Weight Bearing Restrictions: No    Mobility  Bed Mobility Overal bed mobility: Independent                Transfers Overall transfer level: Independent               General transfer comment: Pt was able to ambulate around the entire 26M unit at a Mod I level (increased time) while at the same time scanning environment to look for items I asked her to find. Her only comment was that she was tired.  Ambulation/Gait Ambulation/Gait assistance: Supervision Ambulation Distance (Feet): 200 Feet Assistive device: None Gait Pattern/deviations: Step-to pattern;Decreased step length - left;Decreased stance time - right;Decreased stride length     General Gait Details: pt moves slowly and guarded, but no physical A needed.  pt indicates R side feeling fatigued by this time in day and after activity this am.     Stairs Stairs: Yes Stairs assistance: Supervision Stair Management: One rail  Right;Step to pattern;Forwards Number of Stairs: 11 General stair comments: cues for safe technique.    Wheelchair Mobility    Modified Rankin (Stroke Patients Only) Modified Rankin (Stroke Patients Only) Pre-Morbid Rankin Score: No symptoms Modified Rankin: Moderate disability     Balance Overall balance assessment: Needs assistance Sitting-balance support: No upper extremity supported;Feet supported Sitting balance-Leahy Scale: Good     Standing balance support: No upper extremity supported;During functional activity Standing balance-Leahy Scale: Fair                      Cognition Arousal/Alertness: Awake/alert Behavior During Therapy: WFL for tasks assessed/performed Overall Cognitive Status: Within Functional Limits for tasks assessed                      Exercises Other Exercises Other Exercises: Noted that pt's RUE is quite a bit stronger today 4/5-4+/5 and that the speed of movement is close to normal for gross movements. Speed of writting is slow but legible. Gave her a handout of activies to work on [employer] speed (ie: shuffling cards, dealing cards, flipping cards, dot-to-dot books, typing on a computer. Also noted today that  she has decreasded sensation so I educated  her on really using her vision to watch her RUE when she is using a kinife, use her left hand to test the temperature of water, and use both hands when picking up something heavy (ie: gallon of milk). Proprioceptively her RUE appears WNL  based off of her report that she did not have any trouble holding her utensils or cup at breakfast.     General Comments        Pertinent Vitals/Pain Pain Assessment: No/denies pain    Home Living                      Prior Function            PT Goals (current goals can now be found in the care plan section) Acute Rehab PT Goals Patient Stated Goal: to be able to go home and take care of kids PT Goal Formulation: With patient Time For  Goal Achievement: 10/16/14 Potential to Achieve Goals: Good Progress towards PT goals: Progressing toward goals    Frequency  Min 4X/week    PT Plan Discharge plan needs to be updated    Co-evaluation             End of Session   Activity Tolerance: Patient tolerated treatment well Patient left: in bed;with call bell/phone within reach;with family/visitor present     Time: 1610-9604 PT Time Calculation (min) (ACUTE ONLY): 19 min  Charges:  $Gait Training: 8-22 mins                    G CodesSunny Schlein, [place] [phone] 10/10/2014, 2:13 PM

## 2014-10-10 NOTE — Progress Notes (Signed)
Occupational Therapy Treatment and Discharge Patient Details Name: Nichole AcresKelly Minervini MRN: [id number] DOB: 1975-10-31 Today's Date: 10/10/2014    History of present illness Nichole Black is a 39 y.o. female who presented to [place] this am with right sided weakness and aphasia, [facility]--found to have occluded L ICA, L MCA with successful revascularization, vented and sedated. 10/09/14 extubated PMHx:  mechanical aortic valve repalcement x 2   OT comments  This 39 yo female making excellent progress--exceeding her goals. No further OT needs identified, we will sign off.  Follow Up Recommendations  No OT follow up    Equipment Recommendations  None recommended by OT       Precautions / Restrictions Precautions Precautions: None Restrictions Weight Bearing Restrictions: No       Mobility Bed Mobility Overal bed mobility: Independent                Transfers Overall transfer level: Independent               General transfer comment: Pt was able to ambulate around the entire 25M unit at a Mod I level (increased time) while at the same time scanning environment to look for items I asked her to find. Her only comment was that she was tired.        ADL                                         General ADL Comments: Pt reports she did her entire bath this AM and grooming at sink with the only issue that it made her tired. We talked about her using her daughter's walk in shower that has 2 built in seats so this conserves her energy.                Cognition   Behavior During Therapy: WFL for tasks assessed/performed Overall Cognitive Status: Within Functional Limits for tasks assessed                         Exercises Other Exercises Other Exercises: Noted that pt's RUE is quite a bit stronger today 4/5-4+/5 and that the speed of movement is close to normal for gross movements. Speed of writting is slow but legible. Gave  her a handout of activies to work on [employer] speed (ie: shuffling cards, dealing cards, flipping cards, dot-to-dot books, typing on a computer. Also noted today that  she has decreasded sensation so I educated  her on really using her vision to watch her RUE when she is using a kinife, use her left hand to test the temperature of water, and use both hands when picking up something heavy (ie: gallon of milk). Proprioceptively her RUE appears WNL based off of her report that she did not have any trouble holding her utensils or cup at breakfast.            Pertinent Vitals/ Pain       Pain Assessment: No/denies pain            Progress Toward Goals  OT Goals(current goals can now be found in the care plan section)  Progress towards OT goals:  (Pt has progressed beyond goals and all educaton completed)     Plan Discharge plan needs to be updated       End of Session Equipment Utilized  During Treatment:  (none)   Activity Tolerance Patient limited by fatigue   Patient Left in bed;with call bell/phone within reach           Time: 1308-6578 OT Time Calculation (min): 26 min  Charges: OT General Charges $OT Visit: 1 Procedure OT Treatments $Therapeutic Activity: 23-37 mins  Evette Georges [phone] 10/10/2014, 11:40 AM

## 2014-10-10 NOTE — Progress Notes (Signed)
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  She was transferred to [facility] for neuro-IR intervention and remained on vent after procedure.  STUDIES: 3/02 CT head ([place]) >> mild changes in the insular ribbon and parietal region but still less than 1/3 MCA territory. 3/02 Echo >> EF 65 to 70%  EVENTS: 3/2 IR revascularization >>> occluded L ICA, L MCA with successful revascularization   SUBJECTIVE:  Mild sore throat.  VITAL SIGNS: Temp:  [98.3 F (36.8 C)-99.6 F (37.6 C)] 99.1 F (37.3 C) (03/04 0400) Pulse Rate:  [44-81] 66 (03/04 0604) Resp:  [11-23] 18 (03/04 0604) BP: (88-137)/(42-81) 110/64 mmHg (03/04 0604) SpO2:  [90 %-100 %] 91 % (03/04 0604) FiO2 (%):  [40 %] 40 % (03/03 1000) INTAKE / OUTPUT:  Intake/Output Summary (Last 24 hours) at 10/10/14 0631 Last data filed at 10/10/14 0500  Gross per 24 hour  Intake 1163.8 ml  Output   2250 ml  Net -1086.2 ml    PHYSICAL EXAMINATION: General: no distress Neuro: moves all extremities HEENT: wears glasses Cardiovascular:  s1s2 rrr, mechanical click  Lungs: no wheeze Abdomen: soft, +bs Musculoskeletal:  Warm and dry, no edema   LABS:  PULMONARY  Recent Labs Lab 10/08/14 1350  PHART 7.460*  PCO2ART 28.8*  PO2ART 232.0*  HCO3 20.2  TCO2 21.1  O2SAT 99.7    CBC  Recent Labs Lab 10/08/14 1300 10/09/14 0500 10/10/14 0230  HGB 11.8* 10.3* 10.2*  HCT 36.6 32.9* 32.7*  WBC 17.3* 11.2* 10.4  PLT 267 231 236    COAGULATION  Recent Labs Lab 10/09/14 0705 10/10/14 0230  INR 1.49 1.49   CHEMISTRY  Recent Labs Lab 10/08/14 1300  10/09/14 0500 10/10/14 0230  NA 136 137 139  K 3.8 3.7 3.9  CL 106 104 107  CO2 GLUCOSE 118* 108* 99  BUN CREATININE 0.73 0.64 0.80  CALCIUM 8.5 8.3* 8.6   Estimated Creatinine Clearance: 112.4 mL/min (by C-G formula based on Cr of 0.8).   LIVER  Recent Labs Lab 10/08/14 1300 10/09/14 0705 10/10/14 0230  AST 56*  --   --   ALT 65*  --   --   ALKPHOS 65  --   --   BILITOT 0.8  --   --   PROT 6.4  --   --   ALBUMIN 3.4*  --   --   INR  --  1.49 1.49   IMAGING x48h Ct Head Wo Contrast  10/08/2014   CLINICAL DATA:  Right-sided weakness. Stroke intervention earlier today.  EXAM: CT HEAD WITHOUT CONTRAST  TECHNIQUE: Contiguous axial images were obtained from the base of the skull through the vertex without intravenous contrast.  COMPARISON:  CT hand intervention films earlier same day  FINDINGS: Post intervention scan shows intravascular contrast. There is staining in the left MCA branches probably indicating the absence of flow. There is loss of gray-white differentiation developing in the left temporal lobe in frontoparietal region consistent with infarction in the left MCA territory. No swelling or mass effect at this time. No intraparenchymal hematoma. No hydrocephalus.  No extra-axial collection.  IMPRESSION: Loss of gray-white differentiation an early swelling in the left temporal lobe and frontoparietal region consistent with left MCA region infarction. Hyperdensity associated with left MCA branches related to contrast persistence falling intervention, likely indicating absence of flow in those vessels.   Electronically Signed   By: Paulina FusiMark  Shogry M.D.   On: 10/08/2014 12:29   Mr Maxine GlennMra Head Wo Contrast  10/09/2014   CLINICAL DATA:  RIGHT-sided weakness and aphasia at 2:30 a.m., RIGHT facial droop. Patient was taken for emergent angiography, status post revascularization of occluded LEFT internal carotid artery terminus extending to LEFT middle cerebral artery.  Subsequent the without evaluation.  EXAM: MRI HEAD WITHOUT CONTRAST  MRA HEAD WITHOUT CONTRAST  TECHNIQUE: Multiplanar, multiecho pulse sequences of the brain and surrounding structures were obtained without intravenous contrast. Angiographic images of the head were obtained using MRA technique without contrast.  COMPARISON:  CT of the head October 08, 2014  FINDINGS: MRI HEAD FINDINGS  Patchy areas of reduced diffusion within the LEFT insula, subcentimeter area reduced diffusion in LEFT corona radiata, which show low ADC values consistent with acute ischemia. Scattered predominately tiny peripheral foci of susceptibility artifact in the supratentorial brain. No midline shift, mass effect or mass lesions.  Scattered subcentimeter white matter supratentorial T2 hyperintensities. Minimal FLAIR hyperintense signal within the LEFT insula. No abnormal extra-axial fluid collections.  Mild paranasal sinus mucosal thickening with sphenoid air-fluid level. The mastoid air cells are well aerated. Ocular globes and orbital contents are unremarkable. No abnormal sellar expansion. No cerebellar tonsillar ectopia. No suspicious calvarial bone marrow signal.  MRA HEAD FINDINGS  Anterior circulation: Normal flow related enhancement of the included cervical, petrous, cavernous and supra clinoid internal carotid arteries. Patent anterior communicating artery. 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 Normal flow related enhancement of the anterior and middle cerebral arteries, including more distal segments. Mild luminal regularity of the LEFT M1 segment.  No large vessel occlusion, high-grade stenosis, aneurysm.  Posterior circulation: Codominant vertebral arteries. Basilar artery is patent, with normal flow related enhancement of the main branch vessels. Asymmetric decreased flow related enhancement of the mid to distal RIGHT posterior cerebral artery.  No large  vessel occlusion, high-grade stenosis, abnormal luminal irregularity, aneurysm.  IMPRESSION: MRI HEAD: Multifocal acute ischemia within LEFT middle cerebral artery territory.  Greater than expected punctate foci of susceptibility artifact, greater than expected for age. Differential diagnosis includes prior head injury, remote PRES, hypertensive etiology. Recommend followup MRI of the brain within 6 months including gadolinium to verify stability of findings.  MRA HEAD: No large vessel occlusion, mild luminal irregularity of the LEFT M1 segment may reflect sequelae of recent recanalization, atherosclerosis.  Slight decreased flow related enhancement of the mid to distal RIGHT posterior cerebral artery may reflect atherosclerosis, or even artifact.   Electronically Signed   By: Awilda Metroourtnay  Bloomer   On: 10/09/2014 03:54   Mr Brain Wo Contrast  10/09/2014   CLINICAL DATA:  RIGHT-sided weakness and aphasia at 2:30 a.m., RIGHT facial droop. Patient was taken for emergent angiography, status post revascularization of occluded LEFT internal carotid artery terminus extending to LEFT middle cerebral artery. Subsequent the without evaluation.  EXAM: MRI HEAD WITHOUT CONTRAST  MRA HEAD WITHOUT CONTRAST  TECHNIQUE: Multiplanar, multiecho pulse sequences of the brain and surrounding structures were obtained without intravenous contrast. Angiographic images of the head were obtained using MRA technique without contrast.  COMPARISON:  CT of  the head October 08, 2014  FINDINGS: MRI HEAD FINDINGS  Patchy areas of reduced diffusion within the LEFT insula, subcentimeter area reduced diffusion in LEFT corona radiata, which show low ADC values consistent with acute ischemia. Scattered predominately tiny peripheral foci of susceptibility artifact in the supratentorial brain. No midline shift, mass effect or mass lesions.  Scattered subcentimeter white matter supratentorial T2 hyperintensities. Minimal FLAIR hyperintense signal within the  LEFT insula. No abnormal extra-axial fluid collections.  Mild paranasal sinus mucosal thickening with sphenoid air-fluid level. The mastoid air cells are well aerated. Ocular globes and orbital contents are unremarkable. No abnormal sellar expansion. No cerebellar tonsillar ectopia. No suspicious calvarial bone marrow signal.  MRA HEAD FINDINGS  Anterior circulation: Normal flow related enhancement of the included cervical, petrous, cavernous and supra clinoid internal carotid arteries. Patent anterior communicating artery. 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 Normal flow related enhancement of the anterior and middle cerebral arteries, including more distal segments. Mild luminal regularity of the LEFT M1 segment.  No large vessel occlusion, high-grade stenosis, aneurysm.  Posterior circulation: Codominant vertebral arteries. Basilar artery is patent, with normal flow related enhancement of the main branch vessels. Asymmetric decreased flow related enhancement of the mid to distal RIGHT posterior cerebral artery.  No large vessel occlusion, high-grade stenosis, abnormal luminal irregularity, aneurysm.  IMPRESSION: MRI HEAD: Multifocal acute ischemia within LEFT middle cerebral artery territory.  Greater than expected punctate foci of susceptibility artifact, greater than expected for age. Differential diagnosis includes prior head injury, remote PRES, hypertensive etiology. Recommend followup MRI of the brain within 6 months including gadolinium to verify stability of findings.  MRA HEAD: No large vessel occlusion, mild luminal irregularity of the LEFT M1 segment may reflect sequelae of recent recanalization, atherosclerosis.  Slight decreased flow related enhancement of the mid to distal RIGHT posterior cerebral artery may reflect atherosclerosis, or even artifact.   Electronically Signed   By: Awilda Metro   On: 10/09/2014 03:54    Dg Chest Port 1 View  10/08/2014   CLINICAL DATA:  Aortic valve replacement with bioprosthetic valve. Stroke.  EXAM: PORTABLE CHEST - 1 VIEW  COMPARISON:  10/08/2014.  FINDINGS: Support apparatus: Endotracheal tube tip is 2.8 cm from the carina. Monitoring leads project over the chest.  Cardiomediastinal Silhouette: Within normal limits, accentuated by the low volumes. Median sternotomy and valve replacement.  Lungs: There is increased density in the RIGHT upper lobe. This is not have an appearance typical for pneumothorax. This is favored to represent a skin fold. Pulmonary parenchymal opacities/airspace disease is less likely. Attention on follow-up is recommended. No pneumothorax.  Effusions:  None.  Other:  Surgical clips in the RIGHT axilla  IMPRESSION: 1. Endotracheal intubation with the tip 2.8 cm from the carina. 2. Opacity in the RIGHT upper lobe is new compared to recent prior exam, likely due to a skin fold or artifactual. Attention on follow-up recommended.   Electronically Signed   By: Andreas Newport M.D.   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 P:   Per neurology  PULMONARY A: Ventilator dependence post-procedure >> resolved. P:   Bronchial hygiene  CARDIOVASCULAR A: Hx aortic valve replacement r/t bicuspid valve. HTN, HLD. P:  Coumadin per pharmacy Pravachol  RENAL A: No active issue.  P:   Monitor urine outpt   GASTROINTESTINAL A: Dysphagia. P:   D3 diet  F/u with speech therapy  HEMATOLOGIC A: Mild anemia. P:  F/u CBC  INFECTIOUS A: No active issue. P:   Monitor WBC, fever curve off abx   ENDOCRINE A: No active issue.    P:   Monitor glucose on chemistry  PCCM will sign off.  Coralyn Helling, MD [facility] Pulmonary/Critical Care 10/10/2014, 9:01 AM Pager:  [phone] After 3pm call: [phone]

## 2014-10-10 NOTE — Progress Notes (Signed)
Speech Language Pathology Treatment: Dysphagia;Cognitive-Linquistic  Patient Details Name: Matia Zelada MRN: [id number] DOB: 01-06-1976 Today's Date: 10/10/2014 Time: 1146-4314 SLP Time Calculation (min) (ACUTE ONLY): 13 min  Assessment / Plan / Recommendation Clinical Impression  Pt much improved in all areas per RN. No difficulty with oral or pharyngeal phases of swallow with trials regular texture and thin liquids. Educated pt on compensatory techniques to facilitate dysnomia, with examples provided and for working and anticipatory memory. One episode of expressive difficulty during session. She states she has had dysnomia and some slurred speech for about a year. Pt has met goals and given options for outpatient ST. She stated she preferred to see how she does once home.   HPI HPI: Melea Prezioso is a 39 y.o. female who presented to [place] this am with right sided weakness and aphasia, transferred to [facility] directly going to IR--found to have occluded L ICA, L MCA with successful revascularization, vented and sedated. 10/09/14 extubated PMHx: mechanical aortic valve repalcement x 2   Pertinent Vitals Pain Assessment: No/denies pain  SLP Plan  All goals met;Discharge SLP treatment due to (comment)    Recommendations Diet recommendations: Regular;Thin liquid Liquids provided via: Straw;Cup Medication Administration: Whole meds with liquid Supervision: Patient able to self feed Compensations: Slow rate;Small sips/bites Postural Changes and/or Swallow Maneuvers: Seated upright 90 degrees              Oral Care Recommendations: Oral care BID Follow up Recommendations: None Plan: All goals met;Discharge SLP treatment due to (comment)    GO     Houston Siren 10/10/2014, 3:32 PM  Orbie Pyo Colvin Caroli.Ed [employer] [phone]

## 2014-10-11 ENCOUNTER — Inpatient Hospital Stay (HOSPITAL_COMMUNITY): Payer: 59

## 2014-10-11 LAB — BASIC METABOLIC PANEL
Anion gap: 7 (ref 5–15)
BUN: 10 mg/dL (ref 6–23)
CALCIUM: 8.9 mg/dL (ref 8.4–10.5)
CO2: 23 mmol/L (ref 19–32)
CREATININE: 0.78 mg/dL (ref 0.50–1.10)
Chloride: 106 mmol/L (ref 96–112)
GFR calc Af Amer: >90 mL/min (ref >90)
GFR calc non Af Amer: >90 mL/min (ref >90)
Glucose, Bld: 106 mg/dL — ABNORMAL HIGH (ref 70–99)
POTASSIUM: 3.9 mmol/L (ref 3.5–5.1)
Sodium: 136 mmol/L (ref 135–145)

## 2014-10-11 LAB — CBC
HCT: 33.1 % — ABNORMAL LOW (ref 36.0–46.0)
Hemoglobin: 10.6 g/dL — ABNORMAL LOW (ref 12.0–15.0)
MCH: 25.9 pg — ABNORMAL LOW (ref 26.0–34.0)
MCHC: 32 g/dL (ref 30.0–36.0)
MCV: 80.7 fL (ref 78.0–100.0)
Platelets: 281 10*3/uL (ref 150–400)
RBC: 4.1 MIL/uL (ref 3.87–5.11)
RDW: 15.6 % — AB (ref 11.5–15.5)
WBC: 10 10*3/uL (ref 4.0–10.5)

## 2014-10-11 LAB — PROTIME-INR
INR: 1.54 — ABNORMAL HIGH (ref 0.00–1.49)
Prothrombin Time: 18.6 seconds — ABNORMAL HIGH (ref 11.6–15.2)

## 2014-10-11 MED ORDER — WARFARIN SODIUM 5 MG PO TABS
10.0000 mg | ORAL_TABLET | Freq: Once | ORAL | Status: AC
  Administered 2014-10-11: 10 mg via ORAL
  Filled 2014-10-11: qty 2

## 2014-10-11 NOTE — Progress Notes (Signed)
STROKE TEAM PROGRESS NOTE   HISTORY OF PRESENT ILLNESS Nichole Black is a 39 y.o. female who presented to [place] this am with right sided weakness and aphasia. Initially, she was still able to speak and told the ER physician that symptoms started about 2:30am. She was mild at first, however and got her children ready for school. Her husband then noticed her facial droop and took her to [place] ER where she continued to worsen. She was transferred to [facility] and taken directly to IR.   Her head CT shows some mild changes in the insular ribbon and parietal region but still less than 1/3 MCA territory.  Per family, she has had trouble getting her INR within range lately.  LKW: 2:30am tpa given?: no, out of window.  NIHSS: 21  Complete revascularizatiom of occluded LT ICA terminus ,Lt MCA with x3 passes of Solitaire retrieval ,and IA Integrelin superselectivelly - 10/08/2014 - Dr. Corliss Skains   SUBJECTIVE (INTERVAL HISTORY) No family members present. The patient is without complaints. We discussed her Coumadin therapy. INR still subtherapeutic today (1.54). Pharmacy dosing. Possible discharge home tomorrow with Lovenox bridging; although, concerned about risk of hemorrhagic conversion of stroke. Patient's deficits seem to have resolved.   OBJECTIVE Temp:  [97.8 F (36.6 C)-98.9 F (37.2 C)] 98.4 F (36.9 C) (03/05 0932) Pulse Rate:  [59-76] 68 (03/05 0932) Cardiac Rhythm:  [-] Normal sinus rhythm (03/05 0410) Resp:  [17-21] 18 (03/05 0932) BP: (109-133)/(52-74) 118/68 mmHg (03/05 0932) SpO2:  [96 %-99 %] 96 % (03/05 0932) Weight:  [100.835 kg (222 lb 4.8 oz)] 100.835 kg (222 lb 4.8 oz) (03/04 1856)  No results for input(s): GLUCAP in the last 168 hours.  Recent Labs Lab 10/08/14 1300 10/09/14 0500 10/10/14 0230 10/11/14 0334  NA 136 137 139 136  K 3.8 3.7 3.9 3.9  CL 106 104 107 106  CO2 GLUCOSE 118* 108* 99 106*  BUN CREATININE 0.73 0.64 0.80 0.78  CALCIUM  8.5 8.3* 8.6 8.9    Recent Labs Lab 10/08/14 1300  AST 56*  ALT 65*  ALKPHOS 65  BILITOT 0.8  PROT 6.4  ALBUMIN 3.4*    Recent Labs Lab 10/08/14 1300 10/09/14 0500 10/10/14 0230 10/11/14 0334  WBC 17.3* 11.2* 10.4 10.0  NEUTROABS  --  8.3*  --   --   HGB 11.8* 10.3* 10.2* 10.6*  HCT 36.6 32.9* 32.7* 33.1*  MCV 81.9 81.8 82.4 80.7  PLT 267 231 236 281   No results for input(s): CKTOTAL, CKMB, CKMBINDEX, TROPONINI in the last 168 hours.  Recent Labs  10/09/14 0705 10/10/14 0230 10/11/14 0334  LABPROT 18.2* 18.2* 18.6*  INR 1.49 1.49 1.54*   No results for input(s): COLORURINE, LABSPEC, PHURINE, GLUCOSEU, HGBUR, BILIRUBINUR, KETONESUR, PROTEINUR, UROBILINOGEN, NITRITE, LEUKOCYTESUR in the last 72 hours.  Invalid input(s): APPERANCEUR     Component Value Date/Time   CHOL 148 10/09/2014 0500   TRIG 166* 10/09/2014 0500   HDL 35* 10/09/2014 0500   CHOLHDL 4.2 10/09/2014 0500   VLDL 33 10/09/2014 0500   LDLCALC 80 10/09/2014 0500   Lab Results  Component Value Date   HGBA1C 5.7* 10/09/2014   No results found for: LABOPIA, COCAINSCRNUR, LABBENZ, AMPHETMU, THCU, LABBARB  No results for input(s): ETH in the last 168 hours.  I have personally reviewed the radiological images below and agree with the radiology interpretations.  Ct Head Wo Contrast 10/08/2014    Loss of gray-white  differentiation an early swelling in the left temporal lobe and frontoparietal region consistent with left MCA region infarction. Hyperdensity associated with left MCA branches related to contrast persistence falling intervention, likely indicating absence of flow in those vessels.        Mr Shirlee Latch Wo Contrast 10/09/2014    MRI HEAD:  Multifocal acute ischemia within LEFT middle cerebral artery territory.  Greater than expected punctate foci of susceptibility artifact, greater than expected for age. Differential diagnosis includes prior head injury, remote PRES, hypertensive etiology.  Recommend followup MRI of the brain within 6 months including gadolinium to verify stability of findings.    MRA HEAD:  No large vessel occlusion, mild luminal irregularity of the LEFT M1 segment may reflect sequelae of recent recanalization, atherosclerosis.  Slight decreased flow related enhancement of the mid to distal RIGHT posterior cerebral artery may reflect atherosclerosis, or even artifact.     Dg Chest Port 1 View 10/08/2014    1. Endotracheal intubation with the tip 2.8 cm from the carina.  2. Opacity in the RIGHT upper lobe is new compared to recent prior exam, likely due to a skin fold or artifactual. Attention on follow-up recommended.        Carotid Doppler  RT ICA/CCA ratio: 1.06  LT ICA/CCA ratio: 1.72  1-39% RICA stenosis.  40-69% LICA stenosis.  Bilateral antegrade vertebral arterial flow.    2D Echocardiogram  - Left ventricle: The cavity size was normal. There was mild concentric hypertrophy. Systolic function was vigorous. The estimated ejection fraction was in the range of 65% to 70%. Wall motion was normal; there were no regional wall motion abnormalities. Left ventricular diastolic function parameters were normal. - Aortic valve: Mechanical aortic valve present. Appears to exhibit reasonably normal function. Peak velocity 3.45 m/s. There was trivial regurgitation. Mean gradient (S): 27 mm Hg. - Mitral valve: There was mild regurgitation. - Left atrium: The atrium was mildly dilated. Impressions: - Prosthetic aortic valve was suboptimally visualized. Would recommend TEE if deemed clinically indicated.   PHYSICAL EXAM  Temp:  [97.8 F (36.6 C)-98.9 F (37.2 C)] 98.4 F (36.9 C) (03/05 0932) Pulse Rate:  [59-76] 68 (03/05 0932) Resp:  [17-21] 18 (03/05 0932) BP: (109-133)/(52-74) 118/68 mmHg (03/05 0932) SpO2:  [96 %-99 %] 96  % (03/05 0932) Weight:  [100.835 kg (222 lb 4.8 oz)] 100.835 kg (222 lb 4.8 oz) (03/04 1856)  General - Well nourished, well developed, in no apparent distress.  Ophthalmologic - Sharp disc margins OU.  Cardiovascular - Regular rate and rhythm with no murmur.  Mental Status -  Level of arousal and orientation to time, place, and person were intact. Language including expression, naming, repetition, comprehension was assessed and found intact.  Cranial Nerves II - XII - II - Visual field intact OU. III, IV, VI - Extraocular movements intact. V - Facial sensation decreased on the right. VII - Facial movement intact bilaterally. VIII - Hearing & vestibular intact bilaterally. X - Palate elevates symmetrically. XI - Chin turning & shoulder shrug intact bilaterally. XII - Tongue protrusion intact.  Motor Strength - The patient's strength was normal in all extremities and pronator drift was absent.  Bulk was normal and fasciculations were absent.   Motor Tone - Muscle tone was assessed at the neck and appendages and was normal.  Reflexes - The patient's reflexes were normal in all extremities and she had no pathological reflexes.  Sensory - Light touch, temperature/pinprick were assessed and were slightly decreased on  the right.    Coordination - The patient had normal movements in the hands and feet with no ataxia or dysmetria.  Tremor was absent.  Gait and Station - deferred due to safety concerns.   ASSESSMENT/PLAN [name] is a 38 y.o. female with history of bicuspid aortic valve s/p replacement x 2 with the last one being mechanical valve, on coumadin but INR was 1.4 last check at home. She was admitted with . Right-sided hemiplegia, aphasia, gaze preference. Symptoms much improved after mechanical thrombectomy. Resume coumadin.  Stroke:  Dominant left MCA territory infarct embolic secondary to mechanical wall with subtherapeutic INR. S/p mechanical thrombectomy and symptoms  nearly resolved.  MRI  left MCA territory infarct, much smaller than predicted from previous CT  MRA  large and patent intracranial vessels, with only irregularity of left M1 status post of mechanical thrombectomy  Carotid Doppler  left ICA 40-69% stenosis  2D Echo  unremarkable  LDL 80  HgbA1c 5.7  Lovenox for VTE prophylaxis  Diet Heart   aspirin 81 mg orally every day and warfarin prior to admission, now on aspirin 325 mg orally every day and warfarin. Start Coumadin 10/09/14, with expectation to reach goal INR 2.5-3.5 in 3-5 days which is also the good timing for left MCA stroke. Once INR goal, will stop Lovenox for DVT prophylaxis and change aspirin to 81mg . Pt was on home ASA 81 as per her cardiology along with coumadin.   Patient counseled to be compliant with her antithrombotic medications  Ongoing aggressive stroke risk factor management  Therapy recommendations:  Out pt PT  Disposition:  Discharge in am  Mechanical valve on Coumadin  Subtherapeutic INR, last checked 1.4 at home  This morning INR 1.54  Start Coumadin 10/09/14 with expect patient to reach goal INR 2.5-3.5 in 3 to 5 days  Continue aspirin 325 mg now , but once INR at goal, change back to baby ASA.  BP management  Home meds:   None Permissive hypertension (OK if <220/120) for 24-48 hours post stroke and then gradually normalized within 5-7 days. Currently on no BP meds  Stable  Hyperlipidemia  Home meds:  None   LDL 80, goal < 70  Add pravastatin 20  Continue statin at discharge  Other Stroke Risk Factors  Obesity, Body mass index is 39.39 kg/(m^2).   Other Active Problems  Leukocytosis - resolved  Abnormal chest x-ray as noted above. Will repeat today.  Anemia H/H    10.6/33.1  Carotid Dopplers - 40-69% LICA stenosis.  Other Pertinent History    PLAN  Possible discharge tomorrow with Lovenox bridging  Follow-up Dr. Roda Shutters in 2 months.   Hospital day # 3   Delton See PA-C [facility] Pager [phone] 10/11/2014, 11:57 AM I have personally examined this patient, reviewed notes, independently viewed imaging studies, participated in medical decision making and plan of care. I have made any additions or clarifications directly to the above note. Agree with note above. Patient had a left MCA infarct secondary to terminal left ICA occlusion from cardiogenic embolism from mechanical heart valve with suboptimal anticoagulation. She remains at risk for recurrent embolism, stroke; logical worsening. She has had trouble with her INR lately but unfortunately she is not a candidate for new anticoagulants due to their limited experience in patients with mechanical heart valve. Continue warfarin and may consider discharging in the next couple of days with Lovenox bridging his INR does not come up.  Delia Heady, MD  Medical Director [facility] Pager: [phone] 10/11/2014 3:14 PM    To contact Stroke Continuity provider, please refer to [email or web address]. After hours, contact General Neurology

## 2014-10-11 NOTE — Progress Notes (Signed)
ANTICOAGULATION CONSULT NOTE  Pharmacy Consult for coumadin Indication: AVR  Allergies  Allergen Reactions  . Warfarin Sodium Itching and Other (See Comments)    Cannot take Generic, only brand name  . Hydrocodone Itching    Patient Measurements: Height: 5\' 3"  (160 cm) Weight: 222 lb 4.8 oz (100.835 kg) IBW/kg (Calculated) : 52.4   Vital Signs: Temp: 98.4 F (36.9 C) (03/05 0932) Temp Source: Oral (03/05 0932) BP: 118/68 mmHg (03/05 0932) Pulse Rate: 68 (03/05 0932)  Labs:  Recent Labs  10/09/14 0500 10/09/14 0705 10/10/14 0230 10/11/14 0334  HGB 10.3*  --  10.2* 10.6*  HCT 32.9*  --  32.7* 33.1*  PLT 231  --  236 281  LABPROT  --  18.2* 18.2* 18.6*  INR  --  1.49 1.49 1.54*  CREATININE 0.64  --  0.80 0.78    Estimated Creatinine Clearance: 108.1 mL/min (by C-G formula based on Cr of 0.78).   Medical History: Past Medical History  Diagnosis Date  . S/P aortic valve replacement with bioprosthetic valve     Medications:  Prescriptions prior to admission  Medication Sig Dispense Refill Last Dose  . warfarin (COUMADIN) 7.5 MG tablet Take 7.5 mg by mouth every evening. M,W,F take 1 tablet, all other days take 1/2 tablet   10/08/2014 at Unknown time  . aspirin EC 81 MG tablet Take 81 mg by mouth daily.     Marland Kitchen. ibuprofen (ADVIL,MOTRIN) 200 MG tablet Take 200 mg by mouth every 6 (six) hours as needed.       Assessment: 39 yo female to resume coumadin for AVR.  Her home dose was 7.5 MWF and 3.75 all other days.  INR today is 1.5.  She is s/p IR revascularization for L ICA/ L MCA stroke. Pt is also on Aspirin 325 mg daily and Lovenox 40 mg daily -- at increased bleeding risk.  Goal of Therapy:  INR 2.5-3.5 Monitor platelets by anticoagulation protocol: Yes   Plan:  Repeat Coumadin 10 mg po x1 Daily PT/INR Dc Lovenox when INR therapeutic  Sheppard CoilFrank Wilson PharmD., BCPS Clinical Pharmacist Pager [phone] 10/11/2014 1:50 PM

## 2014-10-11 NOTE — Progress Notes (Signed)
Physical Therapy Treatment Patient Details Name: Nichole Black MRN: [id number] DOB: 01-15-1976 Today's Date: 10/11/2014    History of Present Illness      PT Comments    Pt continues with good progress.  Reporting 'heaviness'/fatigue in RLE after ambulation 200 ft. Visible increased effort noted for toe clearance during swing phase. Seated rest break needed for ~ 3 minutes before ambulating 100 feet back to room. Pt reports continued sensory deficits RUE and shoulder, decreased hot/cold and decreased light touch. HR 67 and BP 120/69 after Rx.  Follow Up Recommendations  Outpatient PT;Supervision - Intermittent     Equipment Recommendations  None recommended by PT    Recommendations for Other Services       Precautions / Restrictions Precautions Precautions: None    Mobility  Bed Mobility Overal bed mobility: Independent                Transfers Overall transfer level: Independent Equipment used: None                Ambulation/Gait Ambulation/Gait assistance: Supervision Ambulation Distance (Feet): 300 Feet (Pt required a 3-minute seated rest break after 200 feet, reporting heaviness RLE.) Assistive device: None Gait Pattern/deviations: Step-through pattern;Decreased stride length Gait velocity: slower and guarded   General Gait Details: pt moves slowly and guarded, but no physical A needed.    Stairs            Wheelchair Mobility    Modified Rankin (Stroke Patients Only) Modified Rankin (Stroke Patients Only) Pre-Morbid Rankin Score: No symptoms Modified Rankin: Moderate disability     Balance   Sitting-balance support: No upper extremity supported;Feet supported Sitting balance-Leahy Scale: Good     Standing balance support: No upper extremity supported;During functional activity Standing balance-Leahy Scale: Fair                      Cognition Arousal/Alertness: Awake/alert Behavior During Therapy: WFL for tasks  assessed/performed Overall Cognitive Status: Within Functional Limits for tasks assessed                      Exercises      General Comments        Pertinent Vitals/Pain Pain Assessment: No/denies pain    Home Living                      Prior Function            PT Goals (current goals can now be found in the care plan section) Progress towards PT goals: Progressing toward goals    Frequency  Min 4X/week    PT Plan Current plan remains appropriate    Co-evaluation             End of Session   Activity Tolerance: Patient tolerated treatment well Patient left: in chair;with family/visitor present;with call bell/phone within reach     Time: 1415-1447 PT Time Calculation (min) (ACUTE ONLY): 32 min  Charges:  $Gait Training: 23-37 mins                    G Codes:      Ilda FoilGarrow, Nichole Black 10/11/2014, 3:00 PM

## 2014-10-12 LAB — CBC
HCT: 34.9 % — ABNORMAL LOW (ref 36.0–46.0)
Hemoglobin: 11.1 g/dL — ABNORMAL LOW (ref 12.0–15.0)
MCH: 25.5 pg — ABNORMAL LOW (ref 26.0–34.0)
MCHC: 31.8 g/dL (ref 30.0–36.0)
MCV: 80.2 fL (ref 78.0–100.0)
PLATELETS: 320 10*3/uL (ref 150–400)
RBC: 4.35 MIL/uL (ref 3.87–5.11)
RDW: 15.4 % (ref 11.5–15.5)
WBC: 11.1 10*3/uL — ABNORMAL HIGH (ref 4.0–10.5)

## 2014-10-12 LAB — GLUCOSE, CAPILLARY
Glucose-Capillary: 145 mg/dL — ABNORMAL HIGH (ref 70–99)
Glucose-Capillary: 91 mg/dL (ref 70–99)

## 2014-10-12 LAB — BASIC METABOLIC PANEL
ANION GAP: 6 (ref 5–15)
BUN: 14 mg/dL (ref 6–23)
CHLORIDE: 105 mmol/L (ref 96–112)
CO2: 24 mmol/L (ref 19–32)
CREATININE: 0.75 mg/dL (ref 0.50–1.10)
Calcium: 8.9 mg/dL (ref 8.4–10.5)
GFR calc Af Amer: >90 mL/min (ref >90)
GFR calc non Af Amer: >90 mL/min (ref >90)
GLUCOSE: 96 mg/dL (ref 70–99)
POTASSIUM: 4.1 mmol/L (ref 3.5–5.1)
Sodium: 135 mmol/L (ref 135–145)

## 2014-10-12 LAB — PROTIME-INR
INR: 1.7 — ABNORMAL HIGH (ref 0.00–1.49)
PROTHROMBIN TIME: 20.1 s — AB (ref 11.6–15.2)

## 2014-10-12 MED ORDER — SENNOSIDES-DOCUSATE SODIUM 8.6-50 MG PO TABS
1.0000 | ORAL_TABLET | Freq: Two times a day (BID) | ORAL | Status: DC
  Administered 2014-10-12 – 2014-10-13 (×3): 1 via ORAL
  Filled 2014-10-12 (×3): qty 1

## 2014-10-12 MED ORDER — POLYETHYLENE GLYCOL 3350 17 G PO PACK
17.0000 g | PACK | Freq: Every day | ORAL | Status: DC | PRN
Start: 2014-10-12 — End: 2014-10-13

## 2014-10-12 MED ORDER — WARFARIN SODIUM 6 MG PO TABS
12.0000 mg | ORAL_TABLET | Freq: Once | ORAL | Status: AC
  Administered 2014-10-12: 12 mg via ORAL
  Filled 2014-10-12: qty 2

## 2014-10-12 NOTE — Discharge Instructions (Signed)
Information on my medicine - Coumadin®   (Warfarin) °Why was Coumadin prescribed for you? °Coumadin was prescribed for you because you have a blood clot or a medical condition that can cause an increased risk of forming blood clots. Blood clots can cause serious health problems by blocking the flow of blood to the heart, lung, or brain. Coumadin can prevent harmful blood clots from forming. °As a reminder your indication for Coumadin is:   Blood Clot Prevention After Heart Valve Surgery ° °What test will check on my response to Coumadin? °While on Coumadin (warfarin) you will need to have an INR test regularly to ensure that your dose is keeping you in the desired range. The INR (international normalized ratio) number is calculated from the result of the laboratory test called prothrombin time (PT). ° °If an INR APPOINTMENT HAS NOT ALREADY BEEN MADE FOR YOU please schedule an appointment to have this lab work done by your health care provider within 7 days. °Your INR goal is a number between:  2.5 to 3.5. ° °What  do you need to  know  About  COUMADIN? °Take Coumadin (warfarin) exactly as prescribed by your healthcare provider about the same time each day.  DO NOT stop taking without talking to the doctor who prescribed the medication.  Stopping without other blood clot prevention medication to take the place of Coumadin may increase your risk of developing a new clot or stroke.  Get refills before you run out. ° °What do you do if you miss a dose? °If you miss a dose, take it as soon as you remember on the same day then continue your regularly scheduled regimen the next day.  Do not take two doses of Coumadin at the same time. ° °Important Safety Information °A possible side effect of Coumadin (Warfarin) is an increased risk of bleeding. You should call your healthcare provider right away if you experience any of the following: °? Bleeding from an injury or your nose that does not stop. °? Unusual colored urine  (red or dark brown) or unusual colored stools (red or black). °? Unusual bruising for unknown reasons. °? A serious fall or if you hit your head (even if there is no bleeding). ° °Some foods or medicines interact with Coumadin® (warfarin) and might alter your response to warfarin. To help avoid this: °? Eat a balanced diet, maintaining a consistent amount of Vitamin K. °? Notify your provider about major diet changes you plan to make. °? Avoid alcohol or limit your intake to 1 drink for women and 2 drinks for men per day. °(1 drink is 5 oz. wine, 12 oz. beer, or 1.5 oz. liquor.) ° °Make sure that ANY health care provider who prescribes medication for you knows that you are taking Coumadin (warfarin).  Also make sure the healthcare provider who is monitoring your Coumadin knows when you have started a new medication including herbals and non-prescription products. ° °Coumadin® (Warfarin)  Major Drug Interactions  °Increased Warfarin Effect Decreased Warfarin Effect  °Alcohol (large quantities) °Antibiotics (esp. Septra/Bactrim, Flagyl, Cipro) °Amiodarone (Cordarone) °Aspirin (ASA) °Cimetidine (Tagamet) °Megestrol (Megace) °NSAIDs (ibuprofen, naproxen, etc.) °Piroxicam (Feldene) °Propafenone (Rythmol SR) °Propranolol (Inderal) °Isoniazid (INH) °Posaconazole (Noxafil) Barbiturates (Phenobarbital) °Carbamazepine (Tegretol) °Chlordiazepoxide (Librium) °Cholestyramine (Questran) °Griseofulvin °Oral Contraceptives °Rifampin °Sucralfate (Carafate) °Vitamin K  ° °Coumadin® (Warfarin) Major Herbal Interactions  °Increased Warfarin Effect Decreased Warfarin Effect  °Garlic °Ginseng °Ginkgo biloba Coenzyme Q10 °Green tea °St. John’s wort   ° °Coumadin® (Warfarin) FOOD Interactions  °  Eat a consistent number of servings per week of foods HIGH in Vitamin K °(1 serving = ½ cup)  °Collards (cooked, or boiled & drained) °Kale (cooked, or boiled & drained) °Mustard greens (cooked, or boiled & drained) °Parsley *serving size only = ¼  cup °Spinach (cooked, or boiled & drained) °Swiss chard (cooked, or boiled & drained) °Turnip greens (cooked, or boiled & drained)  °Eat a consistent number of servings per week of foods MEDIUM-HIGH in Vitamin K °(1 serving = 1 cup)  °Asparagus (cooked, or boiled & drained) °Broccoli (cooked, boiled & drained, or raw & chopped) °Brussel sprouts (cooked, or boiled & drained) *serving size only = ½ cup °Lettuce, raw (green leaf, endive, romaine) °Spinach, raw °Turnip greens, raw & chopped  ° °These websites have more information on Coumadin (warfarin):  www.coumadin.com; °www.ahrq.gov/consumer/coumadin.htm; ° ° ° °

## 2014-10-12 NOTE — Discharge Summary (Signed)
Stroke Discharge Summary  Patient ID: Nichole Black   MRN: [id number]      DOB: Oct 20, 1975  Date of Admission: 10/08/2014 Date of Discharge: 10/13/2014  Attending Physician:  Micki Riley, MD, Stroke MD  Consulting Physician(s):   Treatment Team:  Md Stroke, MD rehabilitation medicine and critical care Dr. Senaida Ores and Dr. Westley Hummer Patient's PCP:  No primary care provider on file.  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 Active Problems:   Stroke   Acute postoperative respiratory failure   Cerebral infarction due to embolism of left middle cerebral artery   Mechanical heart valve present   Chronic anticoagulation   Hyperlipidemia   Morbid obesity  BMI: Body mass index is 39.39 kg/(m^2).  Past Medical History  Diagnosis Date  . S/P aortic valve replacement with bioprosthetic valve    Past Surgical History  Procedure Laterality Date  . Carbomedic top hat supra-annular aortic valve    . Radiology with anesthesia N/A 10/08/2014    Procedure: RADIOLOGY WITH ANESTHESIA;  Surgeon: Oneal Grout, MD;  Location: [facility] OR;  Service: Radiology;  Laterality: N/A;      Medication List    STOP taking these medications        aspirin EC 81 MG tablet     ibuprofen 200 MG tablet  Commonly known as:  ADVIL,MOTRIN      TAKE these medications        pravastatin 20 MG tablet  Commonly known as:  PRAVACHOL  Take 1 tablet (20 mg total) by mouth daily at 6 PM.     warfarin 7.5 MG tablet  Commonly known as:  COUMADIN  Take 1 tablet (7.5 mg total) by mouth every evening. 10 mg once today followed by 7.5 mg daily     warfarin 10 MG tablet  Commonly known as:  COUMADIN  Take 1 tablet (10 mg total) by mouth one time only at 6 PM.        LABORATORY STUDIES CBC    Component Value Date/Time   WBC 11.1* 10/12/2014 0607    RBC 4.35 10/12/2014 0607   HGB 11.1* 10/12/2014 0607   HCT 34.9* 10/12/2014 0607   PLT 320 10/12/2014 0607   MCV 80.2 10/12/2014 0607   MCH 25.5* 10/12/2014 0607   MCHC 31.8 10/12/2014 0607   RDW 15.4 10/12/2014 0607   LYMPHSABS 1.9 10/09/2014 0500   MONOABS 1.0 10/09/2014 0500   EOSABS 0.0 10/09/2014 0500   BASOSABS 0.0 10/09/2014 0500   CMP    Component Value Date/Time   NA 135 10/12/2014 0607   K 4.1 10/12/2014 0607   CL 105 10/12/2014 0607   CO2 24 10/12/2014 0607   GLUCOSE 96 10/12/2014 0607   BUN 14 10/12/2014 0607   CREATININE 0.75 10/12/2014 0607   CALCIUM 8.9 10/12/2014 0607   PROT 6.4 10/08/2014 1300   ALBUMIN 3.4* 10/08/2014 1300   AST 56* 10/08/2014 1300   ALT 65* 10/08/2014 1300   ALKPHOS 65 10/08/2014 1300   BILITOT 0.8 10/08/2014 1300   GFRNONAA >90 10/12/2014 0607   GFRAA >90 10/12/2014 0607   COAGS Lab Results  Component Value Date   INR 2.04* 10/13/2014   INR 1.70* 10/12/2014   INR 1.54* 10/11/2014   Lipid Panel    Component Value Date/Time   CHOL 148 10/09/2014 0500   TRIG 166* 10/09/2014 0500  HDL 35* 10/09/2014 0500   CHOLHDL 4.2 10/09/2014 0500   VLDL 33 10/09/2014 0500   LDLCALC 80 10/09/2014 0500   HgbA1C  Lab Results  Component Value Date   HGBA1C 5.7* 10/09/2014   Cardiac Panel (last 3 results) No results for input(s): CKTOTAL, CKMB, TROPONINI, RELINDX in the last 72 hours. Urinalysis No results found for: COLORURINE, APPEARANCEUR, LABSPEC, PHURINE, GLUCOSEU, HGBUR, BILIRUBINUR, KETONESUR, PROTEINUR, UROBILINOGEN, NITRITE, LEUKOCYTESUR Urine Drug Screen No results found for: LABOPIA, COCAINSCRNUR, LABBENZ, AMPHETMU, THCU, LABBARB  Alcohol Level No results found for: [facility]   SIGNIFICANT DIAGNOSTIC STUDIES    Ct Head Wo Contrast 10/08/2014  Loss of gray-white differentiation an early swelling in the left temporal lobe and frontoparietal region consistent with left MCA region infarction. Hyperdensity associated with left  MCA branches related to contrast persistence falling intervention, likely indicating absence of flow in those vessels.   Mr Shirlee Latch Wo Contrast 10/09/2014   MRI HEAD:  Multifocal acute ischemia within LEFT middle cerebral artery territory. Greater than expected punctate foci of susceptibility artifact, greater than expected for age. Differential diagnosis includes prior head injury, remote PRES, hypertensive etiology. Recommend followup MRI of the brain within 6 months including gadolinium to verify stability of findings.   MRA HEAD:  No large vessel occlusion, mild luminal irregularity of the LEFT M1 segment may reflect sequelae of recent recanalization, atherosclerosis. Slight decreased flow related enhancement of the mid to distal RIGHT posterior cerebral artery may reflect atherosclerosis, or even artifact.   Dg Chest Port 1 View 10/08/2014  1. Endotracheal intubation with the tip 2.8 cm from the carina.  2. Opacity in the RIGHT upper lobe is new compared to recent prior exam, likely due to a skin fold or artifactual. Attention on follow-up recommended.    CHEST 2 VIEW 10/11/2014 Opacity overlying the upper right chest on the prior radiograph is no longer visualize compatible with artifact on the prior study.  No evidence of acute cardiopulmonary disease.   Carotid Doppler RT ICA/CCA ratio: 1.06  LT ICA/CCA ratio: 1.72  1-39% RICA stenosis.  40-69% LICA stenosis.  Bilateral antegrade vertebral arterial flow.    2D Echocardiogram - Left ventricle: The cavity size was normal. There was mild concentric hypertrophy. Systolic function was vigorous. The estimated ejection fraction was in the range of 65% to 70%. Wall motion was normal; there were no regional wall motion abnormalities. Left ventricular diastolic function parameters were  normal. - Aortic valve: Mechanical aortic valve present. Appears to exhibit reasonably normal function. Peak velocity 3.45 m/s. There was trivial regurgitation. Mean gradient (S): 27 mm Hg. - Mitral valve: There was mild regurgitation. - Left atrium: The atrium was mildly dilated. Impressions: - Prosthetic aortic valve was suboptimally visualized. Would recommend TEE if deemed clinically indicated.    HISTORY OF PRESENT ILLNESS Nichole Black is a 39 y.o. female who [facility] with right sided weakness and aphasia. Initially, she was still able to speak and told the ER physician that symptoms started about 2:30am. They were mild at first and she got her children ready for school. Her husband then noticed her facial droop and took her to [facility] ER where she continued to worsen. She was transferred to [facility] and taken directly to IR.   Her head CT shows some mild changes in the insular ribbon and parietal region but still less than 1/3 MCA territory.  She was on Coumadin dosed through the [facility] for an aortic valve  replacement. Per family, she has had trouble getting her INR within range lately.   LKW: 2:30am tpa given?: no, out of window.  NIHSS: 21  Complete revascularizatiom of occluded LT ICA terminus ,Lt MCA with x3 passes of Solitaire retrieval ,and IA Integrelin superselectivelly - 10/08/2014 - Dr. Orpah Clintoneveshwar  HOSPITAL COURSE The patient was admitted to the neuro intensive care unit following her procedure. She was initially on a ventilator which was managed by critical care. Her Coumadin was dosed through the pharmacy and she was also placed on aspirin 325 mg daily. Intravenous heparin and subcutaneous full doseLovenox were considered as bridging for the Coumadin; however, there were concerns that too much anticoagulation too soon would cause a hemorrhagic conversion of her stroke. She did receive DVT prophylaxis  Lovenox. It was felt that once her INR was therapeutic her aspirin should be decreased to 81 mg daily which she had been taking with her Coumadin prior to admission.  She was evaluated by the speech occupational and physical therapists. She was also evaluated by the physical medicine and rehabilitation physician for possible inpatient rehabilitation. Eventually it was felt that outpatient physical therapy would be appropriate. Fortunately the patient had little in the way of deficits.  The patient was transferred to a regular floor. Arrangements were made for discharge to home on 10/13/2014.  DISCHARGE EXAM Blood pressure 110/57, pulse 62, temperature 99.7 F (37.6 C), temperature source Oral, resp. rate 18, height 5\' 3"  (1.6 m), weight 222 lb 4.8 oz (100.835 kg), last menstrual period 10/03/2014, SpO2 99 %. General - Well nourished, well developed, in no apparent distress.  Ophthalmologic - Sharp disc margins OU.  Cardiovascular - Regular rate and rhythm with no murmur.  Mental Status -  Level of arousal and orientation to time, place, and person were intact. Language including expression, naming, repetition, comprehension was assessed and found intact.  Cranial Nerves II - XII - II - Visual field intact OU. III, IV, VI - Extraocular movements intact. V - Facial sensation decreased on the right. VII - Facial movement intact bilaterally. VIII - Hearing & vestibular intact bilaterally. X - Palate elevates symmetrically. XI - Chin turning & shoulder shrug intact bilaterally. XII - Tongue protrusion intact.  Motor Strength - The patient's strength was normal in all extremities and pronator drift was absent. Bulk was normal and fasciculations were absent.  Motor Tone - Muscle tone was assessed at the neck and appendages and was normal.  Reflexes - The patient's reflexes were normal in all extremities and she had no pathological reflexes.  Sensory - Light touch, temperature/pinprick  were assessed and were slightly decreased on the right.   Coordination - The patient had normal movements in the hands and feet with no ataxia or dysmetria. Tremor was absent.  Gait and Station - deferred due to safety concerns.  Discharge Diet   Diet Heart with thin liquids  DISCHARGE PLAN  Disposition: Home   Continue warfarin for secondary stroke prevention.target INR goal 2-5-3.5  Follow-up No primary care provider on file. in 2 weeks.  [facility].  Check PT/INR tomorrow and then as per prior schedule and call Cardiology with results for dosing adjustments    Greater than 40 minutes were spent preparing discharge.   Delton Seeavid Rinehuls PA-C [facility] Pager [phone] 10/13/2014, 1:38 PM  I have personally examined this patient, reviewed notes, independently viewed imaging studies, participated in medical decision making and plan of care.  I have made any additions or clarifications directly to the above note. Agree with note above.   Delia Heady, MD Medical Director [facility] Pager: [phone] 10/13/2014 1:38 PM

## 2014-10-12 NOTE — Progress Notes (Addendum)
ANTICOAGULATION CONSULT NOTE  Pharmacy Consult for coumadin Indication: AVR  Allergies  Allergen Reactions  . Warfarin Sodium Itching and Other (See Comments)    Cannot take Generic, only brand name  . Hydrocodone Itching    Patient Measurements: Height: 5\' 3"  (160 cm) Weight: 222 lb 4.8 oz (100.835 kg) IBW/kg (Calculated) : 52.4   Vital Signs: Temp: 99.7 F (37.6 C) (03/06 1037) Temp Source: Oral (03/06 1037) BP: 112/49 mmHg (03/06 1037) Pulse Rate: 66 (03/06 1037)  Labs:  Recent Labs  10/10/14 0230 10/11/14 0334 10/12/14 0607  HGB 10.2* 10.6* 11.1*  HCT 32.7* 33.1* 34.9*  PLT 236 281 320  LABPROT 18.2* 18.6* 20.1*  INR 1.49 1.54* 1.70*  CREATININE 0.80 0.78 0.75    Estimated Creatinine Clearance: 108.1 mL/min (by C-G formula based on Cr of 0.75).   Medical History: Past Medical History  Diagnosis Date  . S/P aortic valve replacement with bioprosthetic valve     Medications:  Prescriptions prior to admission  Medication Sig Dispense Refill Last Dose  . warfarin (COUMADIN) 7.5 MG tablet Take 7.5 mg by mouth every evening. M,W,F take 1 tablet, all other days take 1/2 tablet   10/08/2014 at Unknown time  . aspirin EC 81 MG tablet Take 81 mg by mouth daily.     Marland Kitchen. ibuprofen (ADVIL,MOTRIN) 200 MG tablet Take 200 mg by mouth every 6 (six) hours as needed.       Assessment: 39 yo female to resume coumadin for AVR.  Her home dose was 7.5 MWF and 3.75 all other days.  INR today is trending up 1.5>>1.7.  She is s/p IR revascularization for L ICA/ L MCA stroke. Pt is also on Aspirin 325 mg daily and Lovenox 40 mg daily -- at increased bleeding risk.  Goal of Therapy:  INR 2.5-3.5 Monitor platelets by anticoagulation protocol: Yes   Plan:  Repeat Coumadin 12 mg po x1 Daily PT/INR Dc Lovenox when INR therapeutic (may need bridge at d/c per neuro)  Sheppard CoilFrank Rashae Rother PharmD., BCPS Clinical Pharmacist Pager [phone] 10/12/2014 2:05 PM

## 2014-10-12 NOTE — Progress Notes (Signed)
STROKE TEAM PROGRESS NOTE   HISTORY OF PRESENT ILLNESS Nichole Black is a 39 y.o. female who presented to [place] this am with right sided weakness and aphasia. Initially, she was still able to speak and told the ER physician that symptoms started about 2:30am. She was mild at first, however and got her children ready for school. Her husband then noticed her facial droop and took her to  ER where she continued to worsen. She was transferred to [facility] and taken directly to IR.   Her head CT shows some mild changes in the insular ribbon and parietal region but still less than 1/3 MCA territory.  Per family, she has had trouble getting her INR within range lately.  LKW: 2:30am tpa given?: no, out of window.  NIHSS: 21  Complete revascularizatiom of occluded LT ICA terminus ,Lt MCA with x3 passes of Solitaire retrieval ,and IA Integrelin superselectivelly - 10/08/2014 - Dr. Corliss Skains   SUBJECTIVE (INTERVAL HISTORY) No family members present. The patient still has right-sided numbness. Otherwise she has no complaints. Pharmacy still dosing Coumadin.INR 1.7 today The patient has agreed to stay until Monday for further adjustments of medications.   OBJECTIVE Temp:  [98 F (36.7 C)-99.7 F (37.6 C)] 99.7 F (37.6 C) (03/06 1037) Pulse Rate:  [65-69] 66 (03/06 1037) Cardiac Rhythm:  [-] Normal sinus rhythm (03/05 2014) Resp:  [16-20] 18 (03/06 1037) BP: (91-120)/(42-69) 112/49 mmHg (03/06 1037) SpO2:  [97 %-100 %] 97 % (03/06 1037)   Recent Labs Lab 10/12/14 0806 10/12/14 1139  GLUCAP 145* 91    Recent Labs Lab 10/08/14 1300 10/09/14 0500 10/10/14 0230 10/11/14 0334 10/12/14 0607  NA 136 137 139 136 135  K 3.8 3.7 3.9 3.9 4.1  CL 106 104 107 106 105  CO2 22 23 25 23 24   GLUCOSE 118* 108* 99 106* 96  BUN 8 8 10 10 14   CREATININE 0.73 0.64 0.80 0.78 0.75  CALCIUM 8.5 8.3* 8.6 8.9 8.9    Recent Labs Lab 10/08/14 1300  AST 56*  ALT 65*  ALKPHOS 65  BILITOT 0.8   PROT 6.4  ALBUMIN 3.4*    Recent Labs Lab 10/08/14 1300 10/09/14 0500 10/10/14 0230 10/11/14 0334 10/12/14 0607  WBC 17.3* 11.2* 10.4 10.0 11.1*  NEUTROABS  --  8.3*  --   --   --   HGB 11.8* 10.3* 10.2* 10.6* 11.1*  HCT 36.6 32.9* 32.7* 33.1* 34.9*  MCV 81.9 81.8 82.4 80.7 80.2  PLT 267 231 236 281 320   No results for input(s): CKTOTAL, CKMB, CKMBINDEX, TROPONINI in the last 168 hours.  Recent Labs  10/10/14 0230 10/11/14 0334 10/12/14 0607  LABPROT 18.2* 18.6* 20.1*  INR 1.49 1.54* 1.70*   No results for input(s): COLORURINE, LABSPEC, PHURINE, GLUCOSEU, HGBUR, BILIRUBINUR, KETONESUR, PROTEINUR, UROBILINOGEN, NITRITE, LEUKOCYTESUR in the last 72 hours.  Invalid input(s): APPERANCEUR     Component Value Date/Time   CHOL 148 10/09/2014 0500   TRIG 166* 10/09/2014 0500   HDL 35* 10/09/2014 0500   CHOLHDL 4.2 10/09/2014 0500   VLDL 33 10/09/2014 0500   LDLCALC 80 10/09/2014 0500   Lab Results  Component Value Date   HGBA1C 5.7* 10/09/2014   No results found for: LABOPIA, COCAINSCRNUR, LABBENZ, AMPHETMU, THCU, LABBARB  No results for input(s): ETH in the last 168 hours.  I have personally reviewed the radiological images below and agree with the radiology interpretations.  Ct Head Wo Contrast 10/08/2014    Loss of gray-white differentiation  an early swelling in the left temporal lobe and frontoparietal region consistent with left MCA region infarction. Hyperdensity associated with left MCA branches related to contrast persistence falling intervention, likely indicating absence of flow in those vessels.        Mr Shirlee Latch Wo Contrast 10/09/2014    MRI HEAD:  Multifocal acute ischemia within LEFT middle cerebral artery territory.  Greater than expected punctate foci of susceptibility artifact, greater than expected for age. Differential diagnosis includes prior head injury, remote PRES, hypertensive etiology. Recommend followup MRI of the brain within 6 months  including gadolinium to verify stability of findings.    MRA HEAD:  No large vessel occlusion, mild luminal irregularity of the LEFT M1 segment may reflect sequelae of recent recanalization, atherosclerosis.  Slight decreased flow related enhancement of the mid to distal RIGHT posterior cerebral artery may reflect atherosclerosis, or even artifact.     Dg Chest Port 1 View 10/08/2014    1. Endotracheal intubation with the tip 2.8 cm from the carina.  2. Opacity in the RIGHT upper lobe is new compared to recent prior exam, likely due to a skin fold or artifactual. Attention on follow-up recommended.       CHEST 2 VIEW 10/11/2014 Opacity overlying the upper right chest on the prior radiograph is no longer visualize compatible with artifact on the prior study.  No evidence of acute cardiopulmonary disease.   Carotid Doppler  RT ICA/CCA ratio: 1.06  LT ICA/CCA ratio: 1.72  1-39% RICA stenosis.  40-69% LICA stenosis.  Bilateral antegrade vertebral arterial flow.    2D Echocardiogram  - Left ventricle: The cavity size was normal. There was mild concentric hypertrophy. Systolic function was vigorous. The estimated ejection fraction was in the range of 65% to 70%. Wall motion was normal; there were no regional wall motion abnormalities. Left ventricular diastolic function parameters were normal. - Aortic valve: Mechanical aortic valve present. Appears to exhibit reasonably normal function. Peak velocity 3.45 m/s. There was trivial regurgitation. Mean gradient (S): 27 mm Hg. - Mitral valve: There was mild regurgitation. - Left atrium: The atrium was mildly dilated. Impressions: - Prosthetic aortic valve was suboptimally visualized. Would recommend TEE if deemed clinically indicated.   PHYSICAL EXAM  Temp:  [98 F (36.7 C)-99.7 F (37.6 C)] 99.7 F (37.6  C) (03/06 1037) Pulse Rate:  [65-69] 66 (03/06 1037) Resp:  [16-20] 18 (03/06 1037) BP: (91-120)/(42-69) 112/49 mmHg (03/06 1037) SpO2:  [97 %-100 %] 97 % (03/06 1037)  General - Well nourished, well developed, in no apparent distress.  Ophthalmologic - Sharp disc margins OU.  Cardiovascular - Regular rate and rhythm with no murmur.  Mental Status -  Level of arousal and orientation to time, place, and person were intact. Language including expression, naming, repetition, comprehension was assessed and found intact.  Cranial Nerves II - XII - II - Visual field intact OU. III, IV, VI - Extraocular movements intact. V - Facial sensation decreased on the right. VII - Facial movement intact bilaterally. VIII - Hearing & vestibular intact bilaterally. X - Palate elevates symmetrically. XI - Chin turning & shoulder shrug intact bilaterally. XII - Tongue protrusion intact.  Motor Strength - The patient's strength was normal in all extremities and pronator drift was absent.  Bulk was normal and fasciculations were absent.   Motor Tone - Muscle tone was assessed at the neck and appendages and was normal.  Reflexes - The patient's reflexes were normal in all extremities and she had  no pathological reflexes.  Sensory - Light touch, temperature/pinprick were assessed and were slightly decreased on the right.    Coordination - The patient had normal movements in the hands and feet with no ataxia or dysmetria.  Tremor was absent.  Gait and Station - deferred due to safety concerns.   ASSESSMENT/PLAN [name] is a 39 y.o. 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 She was admitted with . Right-sided hemiplegia, aphasia, gaze preference. Symptoms much improved after mechanical thrombectomy. Resume coumadin.  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 S/p mechanical thrombectomy and symptoms nearly resolved.  MRI  left MCA territory infarct, much smaller than predicted from previous CT  MRA  large and patent intracranial vessels, with only irregularity of left M1 status post of mechanical thrombectomy  Carotid Doppler  left ICA 40-69% stenosis  2D Echo  unremarkable  LDL 80  HgbA1c 5.7  Lovenox for VTE prophylaxis  Diet Heart   aspirin 81 mg orally every day and warfarin prior to admission, now on aspirin 325 mg orally every day and warfarin. Start Coumadin 10/09/14, with expectation to reach goal INR 2.5-3.5 in 3-5 days which is also the good timing for left MCA stroke. Once INR goal, will stop Lovenox for DVT prophylaxis and change aspirin to 81mg . Pt was on home ASA 81 as per her cardiology along with coumadin.   Patient counseled to be compliant with her antithrombotic medications  Ongoing aggressive stroke risk factor management  Therapy recommendations:  Out pt PT  Disposition:  Discharge in am  Mechanical valve on Coumadin  Subtherapeutic INR, last checked 1.4 at home  This morning INR 1.54  Start Coumadin 10/09/14 with expect patient to reach goal INR 2.5-3.5 in 3 to 5 days  Continue aspirin 325 mg now , but once INR at goal, change back to baby ASA.  BP management  Home meds:   None Permissive hypertension (OK if <220/120) for 24-48 hours post stroke and then gradually normalized within 5-7 days. Currently on no BP meds  Stable  Hyperlipidemia  Home meds:  None   LDL 80, goal < 70  Add pravastatin 20  Continue statin at discharge  Other Stroke Risk Factors  Obesity, Body mass index is 39.39 kg/(m^2).   Other Active Problems  Leukocytosis - resolved  Repeat chest x-ray as noted above.   Mild anemia stable  Carotid Dopplers - 40-69% LICA stenosis.  Other Pertinent History    PLAN  Possible discharge tomorrow with Lovenox bridging. INR today  1.70  Follow-up Dr. Roda ShuttersXu in 2 months.  Left internal carotid artery stenosis will need to be followed.  Continue aspirin 325 mg now , but once INR at goal, change back to baby ASA.  Outpatient physical therapy  Laxative ordered per request.    Hospital day # 4   Delton Seeavid Rinehuls PA-C [facility] Pager [phone] 10/12/2014, 11:58 AM    I have personally examined this patient, reviewed notes, independently viewed imaging studies, participated in medical decision making and plan of care. I have made any additions or clarifications directly to the above note. Agree with note above. Patient had a left MCA infarct secondary to terminal left ICA occlusion from cardiogenic embolism from mechanical heart valve with suboptimal anticoagulation. She remains at risk for recurrent embolism, stroke; logical  worsening. She has had trouble with her INR lately but unfortunately she is not a candidate for new anticoagulants due to their limited experience in patients with mechanical heart valve. Continue warfarin and may consider discharging in the next couple of days with Lovenox bridging his INR does not come up. Delia Heady, MD    To contact Stroke Continuity provider, please refer to [email or web address]. After hours, contact General Neurology

## 2014-10-13 LAB — PROTIME-INR
INR: 2.04 — ABNORMAL HIGH (ref 0.00–1.49)
Prothrombin Time: 23.2 seconds — ABNORMAL HIGH (ref 11.6–15.2)

## 2014-10-13 MED ORDER — WARFARIN SODIUM 10 MG PO TABS: 10.0000 mg | ORAL_TABLET | Freq: Once | ORAL | Status: DC

## 2014-10-13 MED ORDER — PRAVASTATIN SODIUM 20 MG PO TABS: 20.0000 mg | ORAL_TABLET | Freq: Every day | ORAL | Status: AC

## 2014-10-13 MED ORDER — WARFARIN SODIUM 5 MG PO TABS: 10.0000 mg | ORAL_TABLET | Freq: Once | ORAL | Status: DC

## 2014-10-13 MED ORDER — WARFARIN SODIUM 7.5 MG PO TABS: 7.5000 mg | ORAL_TABLET | Freq: Every evening | ORAL | Status: DC

## 2014-10-13 NOTE — Progress Notes (Signed)
Discharge instruction reviewed with patient/family. All questions answered at this time. Transport by family.    Turner Baillie, RN 

## 2014-10-13 NOTE — Progress Notes (Signed)
ANTICOAGULATION CONSULT NOTE  Pharmacy Consult for Coumadin Indication: AVR  Allergies  Allergen Reactions  . Warfarin Sodium Itching and Other (See Comments)    Cannot take Generic, only brand name  . Hydrocodone Itching    Patient Measurements: Height: 5\' 3"  (160 cm) Weight: 222 lb 4.8 oz (100.835 kg) IBW/kg (Calculated) : 52.4   Vital Signs: Temp: 99.7 F (37.6 C) (03/07 0930) Temp Source: Oral (03/07 0930) BP: 110/57 mmHg (03/07 0930) Pulse Rate: 62 (03/07 0930)  Labs:  Recent Labs  10/11/14 0334 10/12/14 0607 10/13/14 0714  HGB 10.6* 11.1*  --   HCT 33.1* 34.9*  --   PLT 281 320  --   LABPROT 18.6* 20.1* 23.2*  INR 1.54* 1.70* 2.04*  CREATININE 0.78 0.75  --     Estimated Creatinine Clearance: 108.1 mL/min (by C-G formula based on Cr of 0.75).   Medical History: Past Medical History  Diagnosis Date  . S/P aortic valve replacement with bioprosthetic valve     Medications:  Prescriptions prior to admission  Medication Sig Dispense Refill Last Dose  . warfarin (COUMADIN) 7.5 MG tablet Take 7.5 mg by mouth every evening. M,W,F take 1 tablet, all other days take 1/2 tablet   10/08/2014 at Unknown time  . aspirin EC 81 MG tablet Take 81 mg by mouth daily.     Marland Kitchen. ibuprofen (ADVIL,MOTRIN) 200 MG tablet Take 200 mg by mouth every 6 (six) hours as needed.       Assessment: 39 yo female to resume coumadin for AVR.  Her home dose was 7.5 MWF and 3.75 all other days.  INR today is trending up 1.5>>1.7>2.04.  She is s/p IR revascularization for L ICA/ L MCA stroke. Pt is also on Aspirin 325 mg daily and Lovenox 40 mg daily -- at increased bleeding risk.  Goal of Therapy:  INR 2.5-3.5 Monitor platelets by anticoagulation protocol: Yes   Plan:  Coumadin 10 mg po x 1 today  Recommend home with 7.5 mg po daily with close INR follow up Daily PT/INR Dc Lovenox when INR therapeutic (may need bridge at d/c per neuro)  Thank you. Okey RegalLisa Rebekah Zackery,  PharmD [phone]  10/13/2014 9:49 AM

## 2014-10-13 NOTE — Progress Notes (Signed)
Physical Therapy Treatment Patient Details Name: Nichole AcresKelly Costilow MRN: [id number] DOB: January 11, 1976 Today's Date: 10/13/2014    History of Present Illness Nichole Black is a 39 y.o. female who presented to [place] this am with right sided weakness and aphasia, [facility]--found to have occluded L ICA, L MCA with successful revascularization, vented and sedated. 10/09/14 extubated PMHx:  mechanical aortic valve repalcement x 2    PT Comments    Patient continues to progress with ambulation and stairs. Patient still having some sensory deficits on R UE. Unable to discern beween hot/cold and light/deep touch. Spent time educating patient on warning signs/symptoms of stroke and ways to educate family as well. Continue to recommend OPPT follow up at discharge.   Follow Up Recommendations  Outpatient PT;Supervision - Intermittent     Equipment Recommendations  None recommended by PT    Recommendations for Other Services       Precautions / Restrictions Precautions Precautions: None    Mobility  Bed Mobility Overal bed mobility: Independent                Transfers Overall transfer level: Independent                  Ambulation/Gait Ambulation/Gait assistance: Modified independent (Device/Increase time) Ambulation Distance (Feet): 400 Feet Assistive device: None   Gait velocity: slower and guarded   General Gait Details: pt moves slowly and guarded, but no physical A needed.    Stairs Stairs: Yes Stairs assistance: Supervision Stair Management: Step to pattern;Forwards Number of Stairs: 10 General stair comments: cues for safe technique.    Wheelchair Mobility    Modified Rankin (Stroke Patients Only) Modified Rankin (Stroke Patients Only) Pre-Morbid Rankin Score: No symptoms Modified Rankin: Moderate disability     Balance     Sitting balance-Leahy Scale: Good       Standing balance-Leahy Scale: Good                       Cognition Arousal/Alertness: Awake/alert Behavior During Therapy: WFL for tasks assessed/performed Overall Cognitive Status: Within Functional Limits for tasks assessed                      Exercises      General Comments        Pertinent Vitals/Pain Pain Assessment: No/denies pain    Home Living                      Prior Function            PT Goals (current goals can now be found in the care plan section) Progress towards PT goals: Progressing toward goals    Frequency  Min 4X/week    PT Plan Current plan remains appropriate    Co-evaluation             End of Session   Activity Tolerance: Patient tolerated treatment well Patient left: in chair;with call bell/phone within reach     Time: 0800-0829 PT Time Calculation (min) (ACUTE ONLY): 29 min  Charges:  $Gait Training: 8-22 mins $Therapeutic Activity: 8-22 mins                    G Codes:      Fredrich BirksRobinette, Julia Elizabeth 10/13/2014, 8:33 AM 10/13/2014 Fredrich Birksobinette, Julia Elizabeth PTA [phone] pager [phone] office

## 2014-10-28 ENCOUNTER — Telehealth (HOSPITAL_COMMUNITY): Payer: Self-pay

## 2014-10-28 NOTE — Telephone Encounter (Signed)
Called pt to schedule angio f/u, no answer. Left voicemail for pt to call back. AW

## 2014-11-06 ENCOUNTER — Other Ambulatory Visit: Payer: Self-pay

## 2014-11-25 ENCOUNTER — Other Ambulatory Visit (HOSPITAL_COMMUNITY): Payer: Self-pay | Admitting: Interventional Radiology

## 2014-11-25 ENCOUNTER — Telehealth (HOSPITAL_COMMUNITY): Payer: Self-pay | Admitting: Interventional Radiology

## 2014-11-25 DIAGNOSIS — I639 Cerebral infarction, unspecified: Secondary | ICD-10-CM

## 2014-11-25 NOTE — Telephone Encounter (Signed)
Called pt, left VM for her to call to schedule clinical f/u visit following acute stroke. JM

## 2014-12-16 ENCOUNTER — Telehealth (HOSPITAL_COMMUNITY): Payer: Self-pay | Admitting: *Deleted

## 2015-02-11 ENCOUNTER — Ambulatory Visit (INDEPENDENT_AMBULATORY_CARE_PROVIDER_SITE_OTHER): Payer: 59 | Admitting: Neurology

## 2015-02-11 ENCOUNTER — Encounter: Payer: Self-pay | Admitting: Neurology

## 2015-02-11 VITALS — BP 118/76 | HR 83 | Ht 63.0 in | Wt 222.5 lb

## 2015-02-11 DIAGNOSIS — R202 Paresthesia of skin: Secondary | ICD-10-CM

## 2015-02-11 MED ORDER — TOPIRAMATE 25 MG PO TABS: 25.0000 mg | ORAL_TABLET | Freq: Two times a day (BID) | ORAL | Status: AC

## 2015-02-11 NOTE — Patient Instructions (Signed)
I had a long d/w patient  And mother about her recent stroke, risk for recurrent stroke/TIAs, personally independently reviewed imaging studies and stroke evaluation results and answered questions.Continue warfarin  for secondary stroke prevention due to mechanical heart valve and maintain strict control of hypertension with blood pressure goal below 130/90, diabetes with hemoglobin A1c goal below 6.5% and lipids with LDL cholesterol goal below 100 mg/dL. I also advised the patient to eat a healthy diet with plenty of whole grains, cereals, fruits and vegetables, exercise regularly and maintain ideal body weight .add Topamax 25 mg once daily for 2 weeks increase if tolerated without side effects to twice daily for post stroke paresthesias and pain. I also counseled her to increase her activity slowly. Greater than 50% of this 25 minute visit were spent in counseling and coordination of care. Followup in the future with me in 6 months or call earlier if necessary  Stroke Prevention Some medical conditions and behaviors are associated with an increased chance of having a stroke. You may prevent a stroke by making healthy choices and managing medical conditions. HOW CAN I REDUCE MY RISK OF HAVING A STROKE?   Stay physically active. Get at least 30 minutes of activity on most or all days.  Do not smoke. It may also be helpful to avoid exposure to secondhand smoke.  Limit alcohol use. Moderate alcohol use is considered to be:  No more than 2 drinks per day for men.  No more than 1 drink per day for nonpregnant women.  Eat healthy foods. This involves:  Eating 5 or more servings of fruits and vegetables a day.  Making dietary changes that address high blood pressure (hypertension), high cholesterol, diabetes, or obesity.  Manage your cholesterol levels.  Making food choices that are high in fiber and low in saturated fat, trans fat, and cholesterol may control cholesterol levels.  Take any  prescribed medicines to control cholesterol as directed by your health care provider.  Manage your diabetes.  Controlling your carbohydrate and sugar intake is recommended to manage diabetes.  Take any prescribed medicines to control diabetes as directed by your health care provider.  Control your hypertension.  Making food choices that are low in salt (sodium), saturated fat, trans fat, and cholesterol is recommended to manage hypertension.  Take any prescribed medicines to control hypertension as directed by your health care provider.  Maintain a healthy weight.  Reducing calorie intake and making food choices that are low in sodium, saturated fat, trans fat, and cholesterol are recommended to manage weight.  Stop drug abuse.  Avoid taking birth control pills.  Talk to your health care provider about the risks of taking birth control pills if you are over 39 years old, smoke, get migraines, or have ever had a blood clot.  Get evaluated for sleep disorders (sleep apnea).  Talk to your health care provider about getting a sleep evaluation if you snore a lot or have excessive sleepiness.  Take medicines only as directed by your health care provider.  For some people, aspirin or blood thinners (anticoagulants) are helpful in reducing the risk of forming abnormal blood clots that can lead to stroke. If you have the irregular heart rhythm of atrial fibrillation, you should be on a blood thinner unless there is a good reason you cannot take them.  Understand all your medicine instructions.  Make sure that other conditions (such as anemia or atherosclerosis) are addressed. SEEK IMMEDIATE MEDICAL CARE IF:   You have  sudden weakness or numbness of the face, arm, or leg, especially on one side of the body.  Your face or eyelid droops to one side.  You have sudden confusion.  You have trouble speaking (aphasia) or understanding.  You have sudden trouble seeing in one or both  eyes.  You have sudden trouble walking.  You have dizziness.  You have a loss of balance or coordination.  You have a sudden, severe headache with no known cause.  You have new chest pain or an irregular heartbeat. Any of these symptoms may represent a serious problem that is an emergency. Do not wait to see if the symptoms will go away. Get medical help at once. Call your local emergency services (911 in [place] Do not drive yourself to the hospital. Document Released: 09/01/2004 Document Revised: 12/09/2013 Document Reviewed: 01/25/2013 [facility] Patient Information 2015 [place], [place]. This information is not intended to replace advice given to you by your health care provider. Make sure you discuss any questions you have with your health care provider.

## 2015-02-11 NOTE — Progress Notes (Signed)
[facility] [address] [place]. [place] [address] [phone]       OFFICE FOLLOW-UP NOTE  [name] Date of Birth:  April 23, 1976 Medical Record Number:  [id number]   HPI: 78 year Caucasian lady seen today for the first office follow-up visit: Hospital admission for stroke on 10/08/14.presented to [facility] hospital with right sided weakness and aphasia. Initially, she was still able to speak and told the ER physician that symptoms started about 2:30am. She was mild at first, however and got her children ready for school. Her husband then noticed her facial droop and took her to [place] ER where she continued to worsen. She was transferred to [facility] and taken directly to IR. Her head CT shows some mild changes in the insular ribbon and parietal region but still less than 1/3 MCA territory.Per family, she has had trouble getting her INR within range lately.  LKW: 2:30am tpa given?: no, out of window.  NIHSS: 21 She was found to have terminal left ICA occlusion and underwent Complete revascularizatiom of occluded LT ICA terminus ,Lt MCA with x3 passes of Solitaire retrieval ,and IA Integrelin superselectivelly - 10/08/2014 - Dr. Corliss Skains. She did very well clinically and made near complete neurological improvement and did not have any therapy needs at the time of discharge. She did have right sided paresthesias and numbness. She was discharged home. She continues to have right-sided paresthesias and numbness and feels she fatigues easily and stamina is not good. She has to rest every 45 hours. She will try to go back to work but was unable to work. She has been taking amitriptyline 25 mg at night which seems to help her sleep. She had a transient episode of dizziness, near fainting, arms getting cold and clammy about a month and a half ago. She had another minor episode at home when she felt tired and cold did not pass out. She does have a history of migraine headaches which have  not increased in frequency to twice per week. She remains on warfarin and the dose has been increased and she feels her INR is now more stable and consistently in the 2.5-3.5 range. 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   ROS:   14 system review of systems is positive for  fatigue, ear pain, trouble swallowing, dry eyes, double vision, eye pain, blurred vision, swollen abdomen, memory loss, dizziness, headache, numbness, speech difficulty, weakness, agitation, decreased concentration, joint pain, walking difficulty, neck pain and stiffness, restless legs, insomnia, daytime sleepiness  PMH:  Past Medical History  Diagnosis Date  . S/P aortic valve replacement with bioprosthetic valve     Social History:  History   Social History  . Marital Status: Married    Spouse Name: N/A  . Number of Children: N/A  . Years of Education: N/A   Occupational History  . Not on file.   Social History Main Topics  . Smoking status: Never Smoker   . Smokeless tobacco: Not on file  . Alcohol Use: No  . Drug Use: No  . Sexual Activity: Not on file   Other Topics Concern  . Not on file   Social History Narrative    Medications:   Current Outpatient Prescriptions on File Prior to Visit  Medication Sig Dispense Refill  .  pravastatin (PRAVACHOL) 20 MG tablet Take 1 tablet (20 mg total) by mouth daily at 6 PM. 30 tablet 3  . warfarin (COUMADIN) 10 MG tablet Take 1 tablet (10 mg total) by mouth one time only at 6 PM. (Patient not taking: Reported on 02/11/2015) 1 tablet 0  . warfarin (COUMADIN) 7.5 MG tablet Take 1 tablet (7.5 mg total) by mouth every evening. 10 mg once today followed by 7.5 mg daily (Patient not taking: Reported on 02/11/2015) 30 tablet 2   No current facility-administered medications on file prior to visit.     Allergies:   Allergies  Allergen Reactions  . Warfarin Sodium Itching and Other (See Comments)    Cannot take Generic, only brand name  . Hydrocodone Itching    Physical Exam General: well developed, well nourished young Caucasian lady, seated, in no evident distress Head: head normocephalic and atraumatic.  Neck: supple with no carotid or supraclavicular bruits Cardiovascular: regular rate and rhythm, no murmurs Musculoskeletal: no deformity Skin:  no rash/petichiae Vascular:  Normal pulses all extremities Filed Vitals:   02/11/15 1124  BP: 118/76  Pulse: 83   Neurologic Exam Mental Status: Awake and fully alert. Oriented to place and time. Recent and remote memory intact. Attention span, concentration and fund of knowledge appropriate. Mood and affect slightly depressed.  Cranial Nerves: Fundoscopic exam reveals sharp disc margins. Pupils equal, briskly reactive to light. Extraocular movements full without nystagmus. Visual fields full to confrontation. Hearing intact. Facial sensation intact. Face, tongue, palate moves normally and symmetrically.  Motor: Normal bulk and tone. Normal strength in all tested extremity muscles. Diminished fine finger movements on the right and orbits left over right upper extremity. Slight dragging of the right leg while walking. Sensory.: intact to touch ,pinprick .position and vibratory sensation. Subjective diminished right hemibody sensation including touch pinprick and vibration Coordination: Rapid alternating movements normal in all extremities. Finger-to-nose and heel-to-shin performed accurately bilaterally. Gait and Station: Arises from chair without difficulty. Stance is normal. Gait demonstrates normal stride length and balance . Able to heel, toe and tandem walk without difficulty.  Reflexes: 1+ and symmetric. Toes downgoing.   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 Patient's anti-Coagulation was suboptimal at the time of the stroke    PLAN: I had a long d/w patient  And mother about her recent stroke, risk for recurrent stroke/TIAs, personally independently reviewed imaging studies and stroke evaluation results and answered questions.Continue warfarin  for secondary stroke prevention due to mechanical heart valve and maintain strict control of hypertension with blood pressure goal below 130/90, diabetes with hemoglobin A1c goal below 6.5% and lipids with LDL cholesterol goal below 100 mg/dL. I also advised the patient to eat a healthy diet with plenty of whole grains, cereals, fruits and vegetables, exercise regularly and maintain ideal body weight .add Topamax 25 mg once daily for 2 weeks increase if tolerated without side effects to twice daily for post stroke paresthesias and pain. I also counseled her to increase her activity slowly. Greater than 50% of this 25 minute visit were spent in counseling and coordination of care. Followup in the future with me in 6 months or call earlier if necessary  Delia HeadyPramod Sethi, MD  Note: This document was prepared with  digital dictation and possible smart Company secretary. Any transcriptional errors that result from this process are unintentional

## 2015-08-12 ENCOUNTER — Encounter: Payer: Self-pay | Admitting: Neurology

## 2015-08-24 ENCOUNTER — Telehealth: Payer: Self-pay | Admitting: *Deleted

## 2015-08-24 ENCOUNTER — Ambulatory Visit: Payer: 59 | Admitting: Neurology

## 2015-08-24 NOTE — Telephone Encounter (Signed)
LMVM for pt to call back and reschedule appt with CM/NP.

## 2015-08-25 NOTE — Telephone Encounter (Signed)
Patient called back to r/s.  Earliest appt is 11/02/15 and was accepted by patient, however, patient is in urgent need of Rx Amitriptyline 25 mg.  She states she has been out for a little while and is having serious withdrawal symptoms.  Can this be filled and sent to [facility].  Thanks!

## 2015-08-26 NOTE — Telephone Encounter (Signed)
I spoke to pt and she had medication given to her by [facility] and is now seeing Dr. Pearlean Brownie.  Needs refill.  I told her that I could get her in sooner for RV.  Made 08-27-15 at 1130.

## 2015-08-27 ENCOUNTER — Ambulatory Visit (INDEPENDENT_AMBULATORY_CARE_PROVIDER_SITE_OTHER): Payer: 59 | Admitting: Nurse Practitioner

## 2015-08-27 ENCOUNTER — Encounter: Payer: Self-pay | Admitting: Nurse Practitioner

## 2015-08-27 VITALS — BP 126/76 | HR 79 | Ht 63.0 in | Wt 216.4 lb

## 2015-08-27 DIAGNOSIS — Z954 Presence of other heart-valve replacement: Secondary | ICD-10-CM

## 2015-08-27 DIAGNOSIS — E785 Hyperlipidemia, unspecified: Secondary | ICD-10-CM | POA: Diagnosis not present

## 2015-08-27 DIAGNOSIS — Z7901 Long term (current) use of anticoagulants: Secondary | ICD-10-CM

## 2015-08-27 DIAGNOSIS — I63412 Cerebral infarction due to embolism of left middle cerebral artery: Secondary | ICD-10-CM | POA: Diagnosis not present

## 2015-08-27 DIAGNOSIS — Z952 Presence of prosthetic heart valve: Secondary | ICD-10-CM

## 2015-08-27 MED ORDER — AMITRIPTYLINE HCL 25 MG PO TABS
25.0000 mg | ORAL_TABLET | Freq: Every day | ORAL | Status: AC
Start: 2015-08-27 — End: 2016-08-26

## 2015-08-27 NOTE — Progress Notes (Signed)
[facility]  PATIENT: Nichole Black DOB: 01/09/1976   REASON FOR VISIT: History of stroke, paresthesias HISTORY FROM: Patient    HISTORY OF PRESENT ILLNESS: HISTORY PS38 year Caucasian lady seen today for the first office follow-up visit: Hospital admission for stroke on 10/08/14.presented to [facility] hospital with right sided weakness and aphasia. Initially, she was still able to speak and told the ER physician that symptoms started about 2:30am. She was mild at first, however and got her children ready for school. Her husband then noticed her facial droop and took her to [place] ER where she continued to worsen. She was transferred to [facility] and taken directly to IR. Her head CT shows some mild changes in the insular ribbon and parietal region but still less than 1/3 MCA territory.Per family, she has had trouble getting her INR within range lately.  LKW: 2:30am tpa given?: no, out of window.  NIHSS: 21 She was found to have terminal left ICA occlusion and underwent Complete revascularizatiom of occluded LT ICA terminus ,Lt MCA with x3 passes of Solitaire retrieval ,and IA Integrelin superselectivelly - 10/08/2014 - Dr. Corliss Skains. She did very well clinically and made near complete neurological improvement and did not have any therapy needs at the time of discharge. She did have right sided paresthesias and numbness. She was discharged home. She continues to have right-sided paresthesias and numbness and feels she fatigues easily and stamina is not good. She has to rest every 45 hours. She will try to go back to work but was unable to work. She has been taking amitriptyline 25 mg at night which seems to help her sleep. She had a transient episode of dizziness, near fainting, arms getting cold and clammy about a month and a half ago. She had another minor episode at home when she felt tired and cold did not pass out. She does have a history of migraine headaches which have not  increased in frequency to twice per week. She remains on warfarin and the dose has been increased and she feels her INR is now more stable and consistently in the 2.5-3.5 range. 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  UPDATE 08/27/2015 Nichole Black, 40 year old female returns for follow-up. She was last seen in the office by Dr. Pearlean Brownie 02/11/2015. She was admitted on 10/08/2014 to [facility] with right-sided weakness and aphasia. She was then transferred to [facility] where  terminal left ICA occlusion was found and underwent Complete revascularizatiom of occluded LT ICA terminus ,Lt MCA with x3 passes of Solitaire retrieval ,and IA Integrelin superselectivelly - 10/08/2014 - Dr. Corliss Skains.She remains on warfarin which she checks every 2 weeks and reports to her cardiologist at [facility]. She has stable on her current dose. She is also on amitriptyline for paresthesias and migraines. This also  helps her to sleep at night. She has been out of her prescription for approximately 2 weeks It was previously ordered by her neurologist at [facility] that she is not longer seeing him. She has not had further stroke or TIA symptoms. She did have an episode several months ago where she was nauseated , vomited and became dizzy. She does have a history of migraines. She returns for follow-up. Previous records reviewed  REVIEW OF SYSTEMS: Full 14 system review of systems performed  and notable only for those listed, all others are neg:  Constitutional:Fatigue  Cardiovascular: neg Ear/Nose/Throat: neg  Skin: neg Eyes:Sensitivity to light  Respiratory: neg Gastroitestinal: neg  Hematology/Lymphatic: neg  Endocrine: neg Musculoskeletal:neg Allergy/Immunology: neg Neurological:History of headaches and dizziness  Psychiatric: neg Sleep :  neg   ALLERGIES: Allergies  Allergen Reactions  . Warfarin Sodium Itching and Other (See Comments)    Cannot take Generic, only brand name  . Hydrocodone Itching    HOME MEDICATIONS: Outpatient Prescriptions Prior to Visit  Medication Sig Dispense Refill  . topiramate (TOPAMAX) 25 MG tablet Take 1 tablet (25 mg total) by mouth 2 (two) times daily. (Patient taking differently: Take 25 mg by mouth 2 (two) times daily as needed. ) 120 tablet 3  . warfarin (COUMADIN) 4 MG tablet daily    . amitriptyline (ELAVIL) 25 MG tablet Take by mouth. Reported on 08/27/2015    . pravastatin (PRAVACHOL) 20 MG tablet Take 1 tablet (20 mg total) by mouth daily at 6 PM. (Patient not taking: Reported on 08/27/2015) 30 tablet 3  . pravastatin (PRAVACHOL) 20 MG tablet Take by mouth.    . warfarin (COUMADIN) 10 MG tablet Take 1 tablet (10 mg total) by mouth one time only at 6 PM. (Patient not taking: Reported on 02/11/2015) 1 tablet 0  . warfarin (COUMADIN) 7.5 MG tablet Take 1 tablet (7.5 mg total) by mouth every evening. 10 mg once today followed by 7.5 mg daily (Patient not taking: Reported on 02/11/2015) 30 tablet 2   No facility-administered medications prior to visit.    PAST MEDICAL HISTORY: Past Medical History  Diagnosis Date  . S/P aortic valve replacement with bioprosthetic valve     PAST SURGICAL HISTORY: Past Surgical History  Procedure Laterality Date  . Carbomedic top hat supra-annular aortic valve    . Radiology with anesthesia N/A 10/08/2014    Procedure: RADIOLOGY WITH ANESTHESIA;  Surgeon: Oneal Grout, MD;  Location: [facility] OR;  Service: Radiology;  Laterality: N/A;    FAMILY HISTORY: History reviewed. No pertinent family history.  SOCIAL HISTORY: Social History   Social History  . Marital Status: Married    Spouse Name: N/A  . Number of Children: N/A  . Years of Education: N/A   Occupational History  . Not on file.   Social History Main Topics  . Smoking status: Never  Smoker   . Smokeless tobacco: Not on file  . Alcohol Use: No  . Drug Use: No  . Sexual Activity: Not on file   Other Topics Concern  . Not on file   Social History Narrative     PHYSICAL EXAM  Filed Vitals:   08/27/15 1119  BP: 126/76  Pulse: 79  Height:  (1.6 m)  Weight: 216 lb 6.4 oz (98.158 kg)   Body mass index is 38.34 kg/(m^2). General: well developed, well nourished  morbidly obese young Caucasian lady, seated, in no evident distress Head: head normocephalic and atraumatic.  Neck: supple with no carotid or supraclavicular bruits Cardiovascular: regular rate and rhythm, no murmurs Musculoskeletal: no deformity Skin: no rash/petichiae Vascular: Normal pulses all extremities  Neurological examination  Mental Status: Awake and fully alert. Oriented to place and time. Recent and remote memory intact. Attention span, concentration and fund of knowledge appropriate. Mood and affect slightly depressed.  Cranial Nerves:  Pupils equal, briskly reactive to light. Extraocular movements full without nystagmus. Visual fields full to confrontation. Hearing intact. Facial sensation intact. Face, tongue, palate moves normally  and symmetrically.  Motor: Normal bulk and tone. Normal strength in all tested extremity muscles. Diminished fine finger movements on the right and orbits left over right upper extremity.  Sensory.: intact to touch ,pinprick .position and vibratory sensation. Subjective diminished right hemibody sensation including touch pinprick and vibration Coordination: Rapid alternating movements normal in all extremities. Finger-to-nose and heel-to-shin performed accurately bilaterally. Gait and Station: Arises from chair without difficulty. Stance is normal. Gait demonstrates normal stride length and balance . Able to heel, toe and tandem walk without difficulty.  Reflexes: 1+ and symmetric. Toes downgoing.   DIAGNOSTIC DATA (LABS, IMAGING, TESTING) - I reviewed  patient records, labs, notes, testing and imaging myself where available.  Lab Results  Component Value Date   WBC 11.1* 10/12/2014   HGB 11.1* 10/12/2014   HCT 34.9* 10/12/2014   MCV 80.2 10/12/2014   PLT 320 10/12/2014      Component Value Date/Time   NA 135 10/12/2014 0607   K 4.1 10/12/2014 0607   CL 105 10/12/2014 0607   CO2 24 10/12/2014 0607   GLUCOSE 96 10/12/2014 0607   BUN 14 10/12/2014 0607   CREATININE 0.75 10/12/2014 0607   CALCIUM 8.9 10/12/2014 0607   PROT 6.4 10/08/2014 1300   ALBUMIN 3.4* 10/08/2014 1300   AST 56* 10/08/2014 1300   ALT 65* 10/08/2014 1300   ALKPHOS 65 10/08/2014 1300   BILITOT 0.8 10/08/2014 1300   GFRNONAA >90 10/12/2014 0607   GFRAA >90 10/12/2014 0607   Lab Results  Component Value Date   CHOL 148 10/09/2014   HDL 35* 10/09/2014   LDLCALC 80 10/09/2014   TRIG 166* 10/09/2014   CHOLHDL 4.2 10/09/2014   Lab Results  Component Value Date   HGBA1C 5.7* 10/09/2014    ASSESSMENT AND PLAN 85 year Caucasian lady with mechanical heart valve with embolic left MCA infarct in March 2016 due to left ICA 80 occlusion treated successfully with mechanical embolectomy with solitaire device and intra-arterial Integrilin with full recanalization with excellent clinical recovery. Patient's anti-Coagulation was suboptimal at the time of the stroke. The patient is a current patient of Dr. Pearlean Brownie  who is out of the office today . This note is sent to the work in doctor.     Continue warfarin for secondary stroke prevention and  mechanical heart valve INR  followed by cardiology at [facility] strict control of hypertension with blood pressure goal below 130/90, todays reading 126/76  lipids with LDL cholesterol goal below 100 mg/dL.Last was 80.  Eat a healthy diet with plenty of whole grains, cereals, fruits and vegetables, She was advised to exercise by walking and increase slowly over time Continue amitriptyline for paresthesias, insomnia and  migraine will refill for one year Follow-up yearly and when necessaryVst time 25 min  Nilda Riggs, [facility], [facility], APRN  [facility] [address], [address] [place], [place] [address] [phone]

## 2015-08-27 NOTE — Patient Instructions (Addendum)
Continue warfarin for secondary stroke prevention due to mechanical heart valve  Maintain strict control of hypertension with blood pressure goal below 130/90, todays reading 126/76  lipids with LDL cholesterol goal below 100 mg/dL.Last was 80.  Eat a healthy diet with plenty of whole grains, cereals, fruits and vegetables, Continue amitriptyline will refill for one year Follow-up yearly and when necessary

## 2015-08-27 NOTE — Progress Notes (Signed)
I have read the note, and I agree with the clinical assessment and plan.  WILLIS,CHARLES KEITH   

## 2015-11-02 ENCOUNTER — Ambulatory Visit: Payer: 59 | Admitting: Neurology

## 2016-01-28 ENCOUNTER — Emergency Department (HOSPITAL_COMMUNITY): Payer: 59

## 2016-01-28 ENCOUNTER — Emergency Department (HOSPITAL_COMMUNITY): Payer: 59 | Admitting: Certified Registered Nurse Anesthetist

## 2016-01-28 ENCOUNTER — Inpatient Hospital Stay (HOSPITAL_COMMUNITY)
Admission: EM | Admit: 2016-01-28 | Discharge: 2016-02-06 | DRG: 023 | Disposition: E | Payer: 59 | Attending: Neurology | Admitting: Neurology

## 2016-01-28 ENCOUNTER — Encounter (HOSPITAL_COMMUNITY): Admission: EM | Disposition: E | Payer: Self-pay | Source: Home / Self Care | Attending: Neurology

## 2016-01-28 ENCOUNTER — Inpatient Hospital Stay (HOSPITAL_COMMUNITY): Payer: 59

## 2016-01-28 ENCOUNTER — Other Ambulatory Visit: Payer: Self-pay

## 2016-01-28 ENCOUNTER — Encounter (HOSPITAL_COMMUNITY): Payer: Self-pay | Admitting: Emergency Medicine

## 2016-01-28 DIAGNOSIS — R000 Tachycardia, unspecified: Secondary | ICD-10-CM | POA: Diagnosis present

## 2016-01-28 DIAGNOSIS — I639 Cerebral infarction, unspecified: Secondary | ICD-10-CM | POA: Diagnosis present

## 2016-01-28 DIAGNOSIS — E785 Hyperlipidemia, unspecified: Secondary | ICD-10-CM | POA: Diagnosis present

## 2016-01-28 DIAGNOSIS — G8194 Hemiplegia, unspecified affecting left nondominant side: Secondary | ICD-10-CM | POA: Diagnosis present

## 2016-01-28 DIAGNOSIS — R739 Hyperglycemia, unspecified: Secondary | ICD-10-CM | POA: Diagnosis present

## 2016-01-28 DIAGNOSIS — Z952 Presence of prosthetic heart valve: Secondary | ICD-10-CM | POA: Insufficient documentation

## 2016-01-28 DIAGNOSIS — Z8673 Personal history of transient ischemic attack (TIA), and cerebral infarction without residual deficits: Secondary | ICD-10-CM

## 2016-01-28 DIAGNOSIS — Z953 Presence of xenogenic heart valve: Secondary | ICD-10-CM | POA: Diagnosis not present

## 2016-01-28 DIAGNOSIS — R509 Fever, unspecified: Secondary | ICD-10-CM

## 2016-01-28 DIAGNOSIS — I6789 Other cerebrovascular disease: Secondary | ICD-10-CM | POA: Diagnosis not present

## 2016-01-28 DIAGNOSIS — J9600 Acute respiratory failure, unspecified whether with hypoxia or hypercapnia: Secondary | ICD-10-CM

## 2016-01-28 DIAGNOSIS — G9340 Encephalopathy, unspecified: Secondary | ICD-10-CM | POA: Diagnosis present

## 2016-01-28 DIAGNOSIS — I63031 Cerebral infarction due to thrombosis of right carotid artery: Secondary | ICD-10-CM | POA: Diagnosis present

## 2016-01-28 DIAGNOSIS — D62 Acute posthemorrhagic anemia: Secondary | ICD-10-CM | POA: Diagnosis present

## 2016-01-28 DIAGNOSIS — R4781 Slurred speech: Secondary | ICD-10-CM | POA: Diagnosis present

## 2016-01-28 DIAGNOSIS — I63411 Cerebral infarction due to embolism of right middle cerebral artery: Secondary | ICD-10-CM

## 2016-01-28 DIAGNOSIS — I63412 Cerebral infarction due to embolism of left middle cerebral artery: Secondary | ICD-10-CM

## 2016-01-28 DIAGNOSIS — I6300 Cerebral infarction due to thrombosis of unspecified precerebral artery: Secondary | ICD-10-CM | POA: Diagnosis not present

## 2016-01-28 DIAGNOSIS — I69391 Dysphagia following cerebral infarction: Secondary | ICD-10-CM | POA: Insufficient documentation

## 2016-01-28 DIAGNOSIS — Z79899 Other long term (current) drug therapy: Secondary | ICD-10-CM

## 2016-01-28 DIAGNOSIS — D72829 Elevated white blood cell count, unspecified: Secondary | ICD-10-CM | POA: Insufficient documentation

## 2016-01-28 DIAGNOSIS — D649 Anemia, unspecified: Secondary | ICD-10-CM | POA: Diagnosis present

## 2016-01-28 DIAGNOSIS — I10 Essential (primary) hypertension: Secondary | ICD-10-CM | POA: Diagnosis present

## 2016-01-28 DIAGNOSIS — J9811 Atelectasis: Secondary | ICD-10-CM | POA: Diagnosis present

## 2016-01-28 DIAGNOSIS — Z6838 Body mass index (BMI) 38.0-38.9, adult: Secondary | ICD-10-CM

## 2016-01-28 DIAGNOSIS — R29810 Facial weakness: Secondary | ICD-10-CM | POA: Diagnosis present

## 2016-01-28 DIAGNOSIS — Q251 Coarctation of aorta: Secondary | ICD-10-CM

## 2016-01-28 DIAGNOSIS — I609 Nontraumatic subarachnoid hemorrhage, unspecified: Secondary | ICD-10-CM | POA: Diagnosis present

## 2016-01-28 DIAGNOSIS — R1310 Dysphagia, unspecified: Secondary | ICD-10-CM | POA: Diagnosis not present

## 2016-01-28 DIAGNOSIS — Z7901 Long term (current) use of anticoagulants: Secondary | ICD-10-CM | POA: Diagnosis not present

## 2016-01-28 DIAGNOSIS — I6930 Unspecified sequelae of cerebral infarction: Secondary | ICD-10-CM | POA: Insufficient documentation

## 2016-01-28 DIAGNOSIS — E669 Obesity, unspecified: Secondary | ICD-10-CM | POA: Diagnosis present

## 2016-01-28 DIAGNOSIS — E876 Hypokalemia: Secondary | ICD-10-CM | POA: Diagnosis present

## 2016-01-28 DIAGNOSIS — Z885 Allergy status to narcotic agent status: Secondary | ICD-10-CM

## 2016-01-28 DIAGNOSIS — I63511 Cerebral infarction due to unspecified occlusion or stenosis of right middle cerebral artery: Principal | ICD-10-CM | POA: Diagnosis present

## 2016-01-28 DIAGNOSIS — J9601 Acute respiratory failure with hypoxia: Secondary | ICD-10-CM | POA: Diagnosis present

## 2016-01-28 DIAGNOSIS — I4901 Ventricular fibrillation: Secondary | ICD-10-CM | POA: Diagnosis not present

## 2016-01-28 DIAGNOSIS — Z9119 Patient's noncompliance with other medical treatment and regimen: Secondary | ICD-10-CM | POA: Diagnosis not present

## 2016-01-28 DIAGNOSIS — R0682 Tachypnea, not elsewhere classified: Secondary | ICD-10-CM | POA: Insufficient documentation

## 2016-01-28 DIAGNOSIS — Z888 Allergy status to other drugs, medicaments and biological substances status: Secondary | ICD-10-CM

## 2016-01-28 DIAGNOSIS — Z01818 Encounter for other preprocedural examination: Secondary | ICD-10-CM

## 2016-01-28 DIAGNOSIS — Z954 Presence of other heart-valve replacement: Secondary | ICD-10-CM | POA: Diagnosis not present

## 2016-01-28 HISTORY — DX: Cerebral infarction, unspecified: I63.9

## 2016-01-28 HISTORY — PX: RADIOLOGY WITH ANESTHESIA: SHX6223

## 2016-01-28 LAB — COMPREHENSIVE METABOLIC PANEL WITH GFR
ALT: 28 U/L (ref 14–54)
AST: 33 U/L (ref 15–41)
Albumin: 4.2 g/dL (ref 3.5–5.0)
Alkaline Phosphatase: 81 U/L (ref 38–126)
Anion gap: 9 (ref 5–15)
BUN: 10 mg/dL (ref 6–20)
CO2: 23 mmol/L (ref 22–32)
Calcium: 9.7 mg/dL (ref 8.9–10.3)
Chloride: 107 mmol/L (ref 101–111)
Creatinine, Ser: 0.91 mg/dL (ref 0.44–1.00)
GFR calc Af Amer: >60 mL/min
GFR calc non Af Amer: >60 mL/min
Glucose, Bld: 124 mg/dL — ABNORMAL HIGH (ref 65–99)
Potassium: 3.9 mmol/L (ref 3.5–5.1)
Sodium: 139 mmol/L (ref 135–145)
Total Bilirubin: 0.5 mg/dL (ref 0.3–1.2)
Total Protein: 7.7 g/dL (ref 6.5–8.1)

## 2016-01-28 LAB — URINE MICROSCOPIC-ADD ON
RBC / HPF: NONE SEEN RBC/hpf (ref 0–5)
WBC UA: NONE SEEN WBC/hpf (ref 0–5)

## 2016-01-28 LAB — BLOOD GAS, ARTERIAL
ACID-BASE DEFICIT: 2.1 mmol/L — AB (ref 0.0–2.0)
BICARBONATE: 22.3 meq/L (ref 20.0–24.0)
Drawn by: 398661
FIO2: 1
LHR: 18 {breaths}/min
O2 SAT: 99.8 %
PCO2 ART: 39.1 mmHg (ref 35.0–45.0)
PEEP: 5 cmH2O
PH ART: 7.375 (ref 7.350–7.450)
Patient temperature: 98.6
TCO2: 23.5 mmol/L (ref 0–100)
VT: 420 mL
pO2, Arterial: 429 mmHg — ABNORMAL HIGH (ref 80.0–100.0)

## 2016-01-28 LAB — URINALYSIS, ROUTINE W REFLEX MICROSCOPIC
Bilirubin Urine: NEGATIVE
Glucose, UA: NEGATIVE mg/dL
Hgb urine dipstick: NEGATIVE
Ketones, ur: NEGATIVE mg/dL
Leukocytes, UA: NEGATIVE
Nitrite: NEGATIVE
Protein, ur: 30 mg/dL — AB
Specific Gravity, Urine: 1.034 — ABNORMAL HIGH (ref 1.005–1.030)
pH: 7.5 (ref 5.0–8.0)

## 2016-01-28 LAB — I-STAT CHEM 8, ED
BUN: 12 mg/dL (ref 6–20)
Calcium, Ion: 1.12 mmol/L (ref 1.12–1.23)
Chloride: 106 mmol/L (ref 101–111)
Creatinine, Ser: 0.8 mg/dL (ref 0.44–1.00)
Glucose, Bld: 123 mg/dL — ABNORMAL HIGH (ref 65–99)
HEMATOCRIT: 43 % (ref 36.0–46.0)
Hemoglobin: 14.6 g/dL (ref 12.0–15.0)
Potassium: 3.9 mmol/L (ref 3.5–5.1)
SODIUM: 141 mmol/L (ref 135–145)
TCO2: 25 mmol/L (ref 0–100)

## 2016-01-28 LAB — CBC WITH DIFFERENTIAL/PLATELET
BASOS ABS: 0.1 10*3/uL (ref 0.0–0.1)
Basophils Relative: 1 %
EOS ABS: 0 10*3/uL (ref 0.0–0.7)
EOS PCT: 0 %
HCT: 41.2 % (ref 36.0–46.0)
Hemoglobin: 12.7 g/dL (ref 12.0–15.0)
Lymphocytes Relative: 7 %
Lymphs Abs: 1.4 10*3/uL (ref 0.7–4.0)
MCH: 24.2 pg — ABNORMAL LOW (ref 26.0–34.0)
MCHC: 30.8 g/dL (ref 30.0–36.0)
MCV: 78.5 fL (ref 78.0–100.0)
Monocytes Absolute: 0.8 10*3/uL (ref 0.1–1.0)
Monocytes Relative: 4 %
Neutro Abs: 17.5 10*3/uL — ABNORMAL HIGH (ref 1.7–7.7)
Neutrophils Relative %: 88 %
PLATELETS: 290 10*3/uL (ref 150–400)
RBC: 5.25 MIL/uL — AB (ref 3.87–5.11)
RDW: 16.1 % — ABNORMAL HIGH (ref 11.5–15.5)
WBC: 19.9 10*3/uL — AB (ref 4.0–10.5)

## 2016-01-28 LAB — RAPID URINE DRUG SCREEN, HOSP PERFORMED
Amphetamines: NOT DETECTED
Barbiturates: NOT DETECTED
Benzodiazepines: NOT DETECTED
Cocaine: NOT DETECTED
Opiates: NOT DETECTED
Tetrahydrocannabinol: NOT DETECTED

## 2016-01-28 LAB — TROPONIN I: Troponin I: 0.05 ng/mL — ABNORMAL HIGH

## 2016-01-28 LAB — APTT: aPTT: 29 s (ref 24–37)

## 2016-01-28 LAB — PROTIME-INR
INR: 1.55 — ABNORMAL HIGH (ref 0.00–1.49)
Prothrombin Time: 18.6 s — ABNORMAL HIGH (ref 11.6–15.2)

## 2016-01-28 LAB — CBG MONITORING, ED: Glucose-Capillary: 106 mg/dL — ABNORMAL HIGH (ref 65–99)

## 2016-01-28 LAB — ETHANOL

## 2016-01-28 LAB — I-STAT TROPONIN, ED: Troponin i, poc: 0 ng/mL (ref 0.00–0.08)

## 2016-01-28 SURGERY — RADIOLOGY WITH ANESTHESIA
Anesthesia: Choice

## 2016-01-28 MED ORDER — ONDANSETRON HCL 4 MG/2ML IJ SOLN
4.0000 mg | Freq: Four times a day (QID) | INTRAMUSCULAR | Status: DC | PRN
Start: 2016-01-28 — End: 2016-02-01
  Filled 2016-01-28: qty 2

## 2016-01-28 MED ORDER — FENTANYL CITRATE (PF) 250 MCG/5ML IJ SOLN
INTRAMUSCULAR | Status: DC | PRN
  Administered 2016-01-28 (×2): 100 ug via INTRAVENOUS
  Administered 2016-01-28: 50 ug via INTRAVENOUS

## 2016-01-28 MED ORDER — STROKE: EARLY STAGES OF RECOVERY BOOK
Freq: Once | Status: AC
  Administered 2016-01-28
  Filled 2016-01-28: qty 1

## 2016-01-28 MED ORDER — FENTANYL BOLUS VIA INFUSION
25.0000 ug | INTRAVENOUS | Status: DC | PRN
  Administered 2016-01-29: 25 ug via INTRAVENOUS
  Filled 2016-01-28: qty 50

## 2016-01-28 MED ORDER — LIDOCAINE HCL (CARDIAC) 20 MG/ML IV SOLN
INTRAVENOUS | Status: DC | PRN
  Administered 2016-01-28: 60 mg via INTRATRACHEAL

## 2016-01-28 MED ORDER — LIDOCAINE HCL 1 % IJ SOLN
INTRAMUSCULAR | Status: AC
  Filled 2016-01-28: qty 20

## 2016-01-28 MED ORDER — ALTEPLASE 30 MG/30 ML FOR INTERV. RAD
1.0000 mg | INTRA_ARTERIAL | Status: AC | PRN
  Administered 2016-01-28: 2 mg via INTRA_ARTERIAL
  Administered 2016-01-28: 1 mg via INTRA_ARTERIAL
  Filled 2016-01-28: qty 30

## 2016-01-28 MED ORDER — SODIUM CHLORIDE 0.9 % IV SOLN
100.0000 mL/h | INTRAVENOUS | Status: DC
  Administered 2016-01-28: 100 mL/h via INTRAVENOUS

## 2016-01-28 MED ORDER — ARTIFICIAL TEARS OP OINT
TOPICAL_OINTMENT | OPHTHALMIC | Status: DC | PRN
  Administered 2016-01-28: 1 via OPHTHALMIC

## 2016-01-28 MED ORDER — EPTIFIBATIDE 20 MG/10ML IV SOLN
INTRAVENOUS | Status: AC
  Filled 2016-01-28: qty 10

## 2016-01-28 MED ORDER — PROPOFOL 10 MG/ML IV BOLUS
INTRAVENOUS | Status: DC | PRN
  Administered 2016-01-28: 150 mg via INTRAVENOUS
  Administered 2016-01-28: 50 mg via INTRAVENOUS

## 2016-01-28 MED ORDER — FENTANYL CITRATE (PF) 2500 MCG/50ML IJ SOLN
25.0000 ug/h | INTRAMUSCULAR | Status: DC
  Administered 2016-01-29: 25 ug/h via INTRAVENOUS
  Filled 2016-01-28 (×2): qty 50

## 2016-01-28 MED ORDER — IOPAMIDOL (ISOVUE-300) INJECTION 61%
INTRAVENOUS | Status: AC
  Administered 2016-01-28: 102 mL
  Filled 2016-01-28: qty 150

## 2016-01-28 MED ORDER — SODIUM CHLORIDE 0.9 % IV SOLN
INTRAVENOUS | Status: DC
  Administered 2016-01-28: 23:00:00 via INTRAVENOUS

## 2016-01-28 MED ORDER — SODIUM CHLORIDE 0.9 % IV SOLN: INTRAVENOUS | Status: DC

## 2016-01-28 MED ORDER — NICARDIPINE HCL IN NACL 20-0.86 MG/200ML-% IV SOLN
INTRAVENOUS | Status: AC
  Administered 2016-01-28: 5 mg via INTRAVENOUS
  Filled 2016-01-28: qty 200

## 2016-01-28 MED ORDER — CEFAZOLIN SODIUM-DEXTROSE 2-3 GM-% IV SOLR
INTRAVENOUS | Status: AC
  Filled 2016-01-28: qty 50

## 2016-01-28 MED ORDER — ROCURONIUM BROMIDE 100 MG/10ML IV SOLN
INTRAVENOUS | Status: DC | PRN
  Administered 2016-01-28 (×2): 10 mg via INTRAVENOUS
  Administered 2016-01-28: 50 mg via INTRAVENOUS
  Administered 2016-01-28: 30 mg via INTRAVENOUS

## 2016-01-28 MED ORDER — NICARDIPINE HCL IN NACL 20-0.86 MG/200ML-% IV SOLN
5.0000 mg/h | INTRAVENOUS | Status: DC
  Administered 2016-01-28: 5 mg via INTRAVENOUS

## 2016-01-28 MED ORDER — DEXTROSE 5 % IV SOLN
10.0000 mg | INTRAVENOUS | Status: DC | PRN
  Administered 2016-01-28: 10 ug/min via INTRAVENOUS

## 2016-01-28 MED ORDER — NICARDIPINE HCL IN NACL 20-0.86 MG/200ML-% IV SOLN: 3.0000 mg/h | INTRAVENOUS | Status: DC

## 2016-01-28 MED ORDER — CEFAZOLIN SODIUM-DEXTROSE 2-3 GM-% IV SOLR
INTRAVENOUS | Status: DC | PRN
  Administered 2016-01-28: 2 g via INTRAVENOUS

## 2016-01-28 MED ORDER — SODIUM CHLORIDE 0.9 % IV SOLN
INTRAVENOUS | Status: DC | PRN
  Administered 2016-01-28: 19:00:00 via INTRAVENOUS

## 2016-01-28 MED ORDER — PROPOFOL 1000 MG/100ML IV EMUL
5.0000 ug/kg/min | INTRAVENOUS | Status: DC
  Administered 2016-01-28 – 2016-01-29 (×2): 50 ug/kg/min via INTRAVENOUS
  Administered 2016-01-29: 19.946 ug/kg/min via INTRAVENOUS
  Administered 2016-01-29: 55 ug/kg/min via INTRAVENOUS
  Administered 2016-01-29: 20 ug/kg/min via INTRAVENOUS
  Administered 2016-01-29: 65 ug/kg/min via INTRAVENOUS
  Administered 2016-01-30: 20 ug/kg/min via INTRAVENOUS
  Filled 2016-01-28: qty 200
  Filled 2016-01-28 (×5): qty 100
  Filled 2016-01-28: qty 200

## 2016-01-28 MED ORDER — SODIUM CHLORIDE 0.9 % IV BOLUS (SEPSIS)
500.0000 mL | Freq: Once | INTRAVENOUS | Status: AC
  Administered 2016-01-28: 500 mL via INTRAVENOUS

## 2016-01-28 MED ORDER — NICARDIPINE HCL IN NACL 40-0.83 MG/200ML-% IV SOLN
5.0000 mg/h | INTRAVENOUS | Status: DC
  Administered 2016-01-28: 5 mg/h via INTRAVENOUS
  Filled 2016-01-28: qty 400

## 2016-01-28 MED ORDER — HYDRALAZINE HCL 20 MG/ML IJ SOLN
10.0000 mg | INTRAMUSCULAR | Status: DC | PRN
  Administered 2016-01-28: 20 mg via INTRAVENOUS
  Filled 2016-01-28: qty 1

## 2016-01-28 MED ORDER — ACETAMINOPHEN 650 MG RE SUPP
650.0000 mg | Freq: Four times a day (QID) | RECTAL | Status: DC | PRN
  Administered 2016-01-30 – 2016-02-01 (×2): 650 mg via RECTAL
  Filled 2016-01-28 (×2): qty 1

## 2016-01-28 MED ORDER — ACETAMINOPHEN 650 MG RE SUPP: 650.0000 mg | RECTAL | Status: DC | PRN

## 2016-01-28 MED ORDER — ACETAMINOPHEN 500 MG PO TABS: 1000.0000 mg | ORAL_TABLET | Freq: Four times a day (QID) | ORAL | Status: DC | PRN

## 2016-01-28 MED ORDER — ACETAMINOPHEN 325 MG PO TABS: 650.0000 mg | ORAL_TABLET | ORAL | Status: DC | PRN

## 2016-01-28 MED ORDER — NITROGLYCERIN 1 MG/10 ML FOR IR/CATH LAB
INTRA_ARTERIAL | Status: AC
  Administered 2016-01-28 (×6): 25 ug
  Filled 2016-01-28: qty 10

## 2016-01-28 MED ORDER — ALTEPLASE (STROKE) FULL DOSE INFUSION
89.0000 mg | Freq: Once | INTRAVENOUS | Status: AC
  Administered 2016-01-28: 89 mg via INTRAVENOUS
  Filled 2016-01-28: qty 100

## 2016-01-28 MED ORDER — SUCCINYLCHOLINE CHLORIDE 20 MG/ML IJ SOLN
INTRAMUSCULAR | Status: DC | PRN
Start: 2016-01-28 — End: 2016-01-28
  Administered 2016-01-28: 150 mg via INTRAVENOUS

## 2016-01-28 MED ORDER — FAMOTIDINE IN NACL 20-0.9 MG/50ML-% IV SOLN
20.0000 mg | Freq: Two times a day (BID) | INTRAVENOUS | Status: DC
  Administered 2016-01-28 – 2016-02-02 (×10): 20 mg via INTRAVENOUS
  Filled 2016-01-28 (×13): qty 50

## 2016-01-28 NOTE — Progress Notes (Signed)
eLink Physician-Brief Progress Note Patient Name: Stephenie AcresKelly Gradel DOB: 1975-10-18 MRN: [id number]   Date of Service  28-Apr-2016  HPI/Events of Note  40 yo female with PMH of mechanical aortic value. Sub therapeutic on Warfarin >> R MCA ischemic CVA. Now s/p mechanical thrombectomy by IR. PCCM consulted for ventilator management and BP control (SBP goal 120-140). Patient seen by PCCM. BP = 148/70, HR = 68, Sat = 100% and RR = 24. ABG and CXR pending. Sedation with Propofol and BP control with Nicardipine IV infusion and Hydralazine IV PRN.  eICU Interventions  Continue present management.      Intervention Category Evaluation Type: New Patient Evaluation  Lenell AntuSommer,Dayzee Trower [place] 28-Apr-2016, 10:03 PM

## 2016-01-28 NOTE — Procedures (Signed)
S/P RT common carotid arteriogram,follwed by complete revascularization of occluded RT MCA  With x 2 passes with Solitaire Fr 4mm x 40 mm retrieval device and 3mg  of superselective intracranial IA TPA with TICI 3 flow restoration.

## 2016-01-28 NOTE — Code Documentation (Signed)
40 year old female with history of prothestic AVR and hx of left mca stroke in March 2016.  Today her was with her family all was normal when she had sudden onset at 83 of left side weakness - slurred speech - EMS was called and code stroke called in the field.  See flowsheets for times.  On arrival she is alert - left facial droop ,slurred speech left hemiparesis - left neglect.  She was met at the bridge by the ED staff and stroke team - taken to CT scan - CT without contrast and CTA were done.Initial NIHHS 17.   Patient is on Coumadin.  INR resulted 1.55. Foley cath was inserted and tPA was administered per order Dr. Leonel Ramsay who is at the bedside. 500 cc NS bolus was started for BP 138/69.  Patients family to bedside.  She remains alert - restless.  Tol tPA at this time.  To IR at 1900.  Handoff to [employer] and [place] - tPA orders and infusion reviewed.  Dr. Estanislado Pandy at bedside.  Family updated.

## 2016-01-28 NOTE — Transfer of Care (Signed)
Immediate Anesthesia Transfer of Care Note  Patient: Nichole Black  Procedure(s) Performed: Procedure(s): RADIOLOGY WITH ANESTHESIA (N/A)  Patient Location: NICU  Anesthesia Type:General  Level of Consciousness: Patient remains intubated per anesthesia plan  Airway & Oxygen Therapy: Patient placed on Ventilator (see vital sign flow sheet for setting)  Post-op Assessment: Report given to RN and Post -op Vital signs reviewed and stable  Post vital signs: Reviewed and stable  Last Vitals:  Filed Vitals:   22-Aug-2015 1900 22-Aug-2015 2129  BP: 132/79 148/70  Pulse: 83 68  Resp: 21 24    Last Pain: There were no vitals filed for this visit.       Complications: No apparent anesthesia complications

## 2016-01-28 NOTE — Consult Note (Signed)
PULMONARY / CRITICAL CARE MEDICINE   Name: Nichole AcresKelly Black MRN: [id number] DOB: Aug 11, 1975    ADMISSION DATE:  2015/11/07 CONSULTATION DATE:  6/22  REFERRING MD:  Amada JupiterKirkpatrick  CHIEF COMPLAINT:  L sided weakness  HISTORY OF PRESENT ILLNESS:  Patient is encephalopathic and/or intubated. Therefore history has been obtained from chart review.  40 year old female with PMH as below, which is significant for aortic valve replacement and CVA. She had repotedly been recovering wel from prior stroke. 6/22 she was having an argument at home with son when she developed acute onset L sided weakness and suffered a fall. She was last known well until around 3:30-4pm at which point she went to sleep. Her husband went to wake her up around 430 and found her on floor. He called 911 and she was transported to ED where she was given tpa for suspected CVA. Ct angiogram showed MCA infarct which is thought to be embolic in nature. She was then taken to IR for intervention and recovered in ICU on ventilator. PCCM to assist with medical management.   PAST MEDICAL HISTORY :  She  has a past medical history of S/P aortic valve replacement with bioprosthetic valve and Stroke (HCC).  PAST SURGICAL HISTORY: She  has past surgical history that includes Carbomedic top hat supra-annular aortic valve and Radiology with anesthesia (N/A, 10/08/2014).  Allergies  Allergen Reactions  . Warfarin Sodium Itching and Other (See Comments)    Cannot take Generic, only brand name  . Hydrocodone Itching    No current facility-administered medications on file prior to encounter.   Current Outpatient Prescriptions on File Prior to Encounter  Medication Sig  . amitriptyline (ELAVIL) 25 MG tablet Take 1 tablet (25 mg total) by mouth at bedtime. Reported on 08/27/2015  . pravastatin (PRAVACHOL) 20 MG tablet Take 1 tablet (20 mg total) by mouth daily at 6 PM. (Patient not taking: Reported on 08/27/2015)  . topiramate (TOPAMAX) 25 MG tablet Take  1 tablet (25 mg total) by mouth 2 (two) times daily. (Patient taking differently: Take 25 mg by mouth 2 (two) times daily as needed. )  . warfarin (COUMADIN) 4 MG tablet daily    FAMILY HISTORY:  Her has no family status information on file.   SOCIAL HISTORY: She  reports that she has never smoked. She does not have any smokeless tobacco history on file. She reports that she does not drink alcohol or use illicit drugs.  REVIEW OF SYSTEMS:   Unable as patient is encephalopathic and intubated  SUBJECTIVE:    VITAL SIGNS: BP 132/79 mmHg  Pulse 83  Resp 21  Ht 5\' 3"  (1.6 m)  Wt 98.6 kg (217 lb 6 oz)  BMI 38.52 kg/m2  SpO2 97%  LMP 12/28/2015  HEMODYNAMICS:    VENTILATOR SETTINGS:    INTAKE / OUTPUT:    PHYSICAL EXAMINATION:  General:  Obese female in NAD on vent Neuro:  Sedated HEENT:  [place]/AT, PERRL, no JVD Cardiovascular:  RRR, 2/6 SEM and click Lungs:  Clear bilateral breath sounds Abdomen:  Soft, non-distended Musculoskeletal:  No acute deformity Skin:  Grossly intact  LABS:  BMET  Recent Labs Lab 29-Jan-2016 1808 29-Jan-2016 1810  NA 141 139  K 3.9 3.9  CL 106 107  CO2  --  23  BUN 12 10  CREATININE 0.80 0.91  GLUCOSE 123* 124*    Electrolytes  Recent Labs Lab 29-Jan-2016 1810  CALCIUM 9.7    CBC  Recent Labs Lab 29-Jan-2016  1808 February 19, 2016 1810  WBC  --  19.9*  HGB 14.6 12.7  HCT 43.0 41.2  PLT  --  290    Coag's  Recent Labs Lab 02-19-2016 1810  APTT 29  INR 1.55*    Sepsis Markers No results for input(s): LATICACIDVEN, PROCALCITON, O2SATVEN in the last 168 hours.  ABG No results for input(s): PHART, PCO2ART, PO2ART in the last 168 hours.  Liver Enzymes  Recent Labs Lab 02/19/16 1810  AST 33  ALT 28  ALKPHOS 81  BILITOT 0.5  ALBUMIN 4.2    Cardiac Enzymes  Recent Labs Lab Feb 19, 2016 1810  TROPONINI 0.05*    Glucose  Recent Labs Lab 2016/02/19 1828  GLUCAP 106*    Imaging Ct Angio Head W Or Wo  Contrast  February 19, 2016  CLINICAL DATA:  40 year old female with mechanical heart valve. Code stroke with left side weakness. Hyperdense right MCA on noncontrast head CT. Left MCA infarcts demonstrated by MRI in 2016. Initial encounter. EXAM: CT ANGIOGRAPHY HEAD AND NECK TECHNIQUE: Multidetector CT imaging of the head and neck was performed using the standard protocol during bolus administration of intravenous contrast. Multiplanar CT image reconstructions and MIPs were obtained to evaluate the vascular anatomy. Carotid stenosis measurements (when applicable) are obtained utilizing NASCET criteria, using the distal internal carotid diameter as the denominator. CONTRAST:  50 mL Isovue-300 COMPARISON:  Head CT without contrast 1754 hours today, 10/09/2014 brain MRI and intracranial MRA. FINDINGS: CT HEAD Reported separately. CTA NECK Skeleton: Partially visible sequelae of median sternotomy. No acute osseous abnormality identified. Other neck: Negative lung apices. No superior mediastinal lymphadenopathy. Thyroid, larynx, pharynx, parapharyngeal spaces, retropharyngeal space, sublingual space, submandibular glands, parotid glands, and cervical lymph nodes are within normal limits. Aortic arch: 3 vessel arch configuration. No arch atherosclerosis or great vessel origin stenosis. Right carotid system: Mild motion artifact in the lower neck. Negative right CCA. Negative right carotid bifurcation. Negative cervical right ICA. Left carotid system: Mild motion artifact at the level of the lower left CCA which appears to remain normal. Left carotid bifurcation appears widely patent, but distal to the bulb the cervical left ICA is diminutive and remain so to the level of the skullbase. Left ICA caliber is about half that on the right throughout the neck. Vertebral arteries:No proximal subclavian artery stenosis. Normal vertebral artery origins, the right vertebral artery origin is somewhat early. Both vertebral arteries are  patent to the skullbase without stenosis. CTA HEAD Posterior circulation: Codominant distal vertebral arteries are normal to the vertebrobasilar junction. Normal right PICA origin. Normal AICA origins. No basilar stenosis. SCA and PCA origins are normal. Diminutive posterior communicating arteries. Bilateral PCA branches are within normal limits. Anterior circulation: Diminutive left ICA siphon remains patent to the anterior genu, but the left ICA terminus appears functionally occluded. There are small left lenticulostriate and left MCA M2 region collaterals, but with little overall left MCA region enhancement. The left ACA A1 segment is chronically diminutive or absent. The right ICA siphon is patent to the right ICA terminus. The right ACA origin is normal. The left ACA is chronically or congenitally supplied from the right side. There is mild motion artifact along the proximal ACA is which appear patent. The distal ACA branches appear within normal limits. Right MCA origin is patent but the right M1 is occluded 11 mm beyond its origin (see series 407, image 45). There is some right M2 collateral enhancement in the sylvian fissure. Anatomic variants: Diminutive or absent left ACA A1 segment such  that both ACA is are supplied from the right. IMPRESSION: 1. Positive for emergent large vessel occlusion: Right MCA M1 segment. Some collateral enhancement visible in the right sylvian fissure. This was discussed by telephone with Dr. Ritta Slot on 2016/02/06 At 1820 hours. 2. Occlusion of the left ICA terminus since the MRA on 10/09/2014. Faint collateral enhancement in the left lenticulostriate and left MCA territory. 3. Normal appearing bilateral ACAs, the left is supplied from the right due to diminutive or absent left A1. 4. No atherosclerosis or stenosis in the neck. Negative posterior circulation. Electronically Signed   By: Odessa Fleming M.D.   On: 02/06/16 18:47   Ct Head Wo Contrast  02-06-2016  ADDENDUM  REPORT: 02-06-16 18:49 ADDENDUM: With regard to impression # 4, the following correction is noted: The chronic appearing bilateral ACA territory infarcts have occurred since March 2016. (not March 2017) Electronically Signed   By: Odessa Fleming M.D.   On: 06-Feb-2016 18:49  2016-02-06  CLINICAL DATA:  40 year old female code stroke. Left side weakness last seen normal at 1600 hours. Initial encounter. EXAM: CT HEAD WITHOUT CONTRAST TECHNIQUE: Contiguous axial images were obtained from the base of the skull through the vertex without intravenous contrast. COMPARISON:  CTA head and neck from today reported separately. FINDINGS: Hyperdense right MCA M1 (series 201, image 9). No CT changes of acute right MCA territory infarct. See CTA findings today reported separately. ASPECTS score = 10, with regard to the right MCA territory. [place] Stroke Program Early CT Score Normal score = 10 New bilateral ACA territory encephalomalacia since 10/09/2014. Mild posterior insula left MCA territory encephalomalacia corresponding to the March ischemia. Posterior fossa gray-white matter differentiation remains normal. No acute intracranial hemorrhage identified. No midline shift, mass effect, or evidence of intracranial mass lesion. No ventriculomegaly. No acute osseous abnormality identified. Stable and well pneumatized visualized paranasal sinuses and mastoids. Rightward gaze deviation. Negative scalp soft tissues. IMPRESSION: 1. Hyperdense right M1 segment, see CTA findings reported separately. 2. No CT changes of right MCA infarct identified; ASPECTS = 10. No acute intracranial hemorrhage. 3. The above was discussed by telephone with Dr. Ritta Slot on Feb 06, 2016 at 1820 hours. 4. New but chronic appearing bilateral anterior ACA territory infarcts since March. Expected evolution of the left MCA insula infarcts seen at that time. Electronically Signed: By: Odessa Fleming M.D. On: 02/06/16 18:27   Ct Angio Neck W Or Wo  Contrast  02/06/2016  CLINICAL DATA:  39 year old female with mechanical heart valve. Code stroke with left side weakness. Hyperdense right MCA on noncontrast head CT. Left MCA infarcts demonstrated by MRI in 2016. Initial encounter. EXAM: CT ANGIOGRAPHY HEAD AND NECK TECHNIQUE: Multidetector CT imaging of the head and neck was performed using the standard protocol during bolus administration of intravenous contrast. Multiplanar CT image reconstructions and MIPs were obtained to evaluate the vascular anatomy. Carotid stenosis measurements (when applicable) are obtained utilizing NASCET criteria, using the distal internal carotid diameter as the denominator. CONTRAST:  50 mL Isovue-300 COMPARISON:  Head CT without contrast 1754 hours today, 10/09/2014 brain MRI and intracranial MRA. FINDINGS: CT HEAD Reported separately. CTA NECK Skeleton: Partially visible sequelae of median sternotomy. No acute osseous abnormality identified. Other neck: Negative lung apices. No superior mediastinal lymphadenopathy. Thyroid, larynx, pharynx, parapharyngeal spaces, retropharyngeal space, sublingual space, submandibular glands, parotid glands, and cervical lymph nodes are within normal limits. Aortic arch: 3 vessel arch configuration. No arch atherosclerosis or great vessel origin stenosis. Right carotid system: Mild motion artifact  in the lower neck. Negative right CCA. Negative right carotid bifurcation. Negative cervical right ICA. Left carotid system: Mild motion artifact at the level of the lower left CCA which appears to remain normal. Left carotid bifurcation appears widely patent, but distal to the bulb the cervical left ICA is diminutive and remain so to the level of the skullbase. Left ICA caliber is about half that on the right throughout the neck. Vertebral arteries:No proximal subclavian artery stenosis. Normal vertebral artery origins, the right vertebral artery origin is somewhat early. Both vertebral arteries are  patent to the skullbase without stenosis. CTA HEAD Posterior circulation: Codominant distal vertebral arteries are normal to the vertebrobasilar junction. Normal right PICA origin. Normal AICA origins. No basilar stenosis. SCA and PCA origins are normal. Diminutive posterior communicating arteries. Bilateral PCA branches are within normal limits. Anterior circulation: Diminutive left ICA siphon remains patent to the anterior genu, but the left ICA terminus appears functionally occluded. There are small left lenticulostriate and left MCA M2 region collaterals, but with little overall left MCA region enhancement. The left ACA A1 segment is chronically diminutive or absent. The right ICA siphon is patent to the right ICA terminus. The right ACA origin is normal. The left ACA is chronically or congenitally supplied from the right side. There is mild motion artifact along the proximal ACA is which appear patent. The distal ACA branches appear within normal limits. Right MCA origin is patent but the right M1 is occluded 11 mm beyond its origin (see series 407, image 45). There is some right M2 collateral enhancement in the sylvian fissure. Anatomic variants: Diminutive or absent left ACA A1 segment such that both ACA is are supplied from the right. IMPRESSION: 1. Positive for emergent large vessel occlusion: Right MCA M1 segment. Some collateral enhancement visible in the right sylvian fissure. This was discussed by telephone with Dr. Ritta SlotMCNEILL KIRKPATRICK on 2016/07/26 At 1820 hours. 2. Occlusion of the left ICA terminus since the MRA on 10/09/2014. Faint collateral enhancement in the left lenticulostriate and left MCA territory. 3. Normal appearing bilateral ACAs, the left is supplied from the right due to diminutive or absent left A1. 4. No atherosclerosis or stenosis in the neck. Negative posterior circulation. Electronically Signed   By: Odessa FlemingH  Hall M.D.   On: 02017/12/19 18:47     STUDIES:  CT angiogram head 6/22 >  Right MCA M1 segment emergent occlusion, occlusion of the left ICA terminus.   CULTURES:   ANTIBIOTICS:   SIGNIFICANT EVENTS: 6/22 admit with CVA. Got tpa and to neuro IR. Then to ICU intubated.  LINES/TUBES: ETT 6/22  DISCUSSION: 40 year old female with PMH of AVR and prior stroke admitted 6/22 for R MCA CVA. She received tpa and IR revascularization. She is in ICU for recovery on vent. Requiring nicardipine for BP control.  ASSESSMENT / PLAN:  PULMONARY A: Inability to protect airway secondary to medical sedation  P:   Full vent support CXR for ETT placement ABG Vent Bundle  CARDIOVASCULAR A:  H/o Aortic valve replacement (bioprosthetic valve)  P:  Telemetry monitoring SBP goal 120-140 per IR Nicardipine infusion and PRN hydralazine Anticoagulation per neurology  RENAL A:   No acute issues  P:   Follow BMP  GASTROINTESTINAL A:   No acute issues  P:   Pepcid for SUP NPO  HEMATOLOGIC A:   On chronic warfarin  P:  Follow CBC Anticoagulation per neurology > start heparin 24 hrs post tpa Holding warfarin  INFECTIOUS A:  Leukocytosis  P:   Monitor off ABX Monitor WBC and fever curve  ENDOCRINE A:   Hyperglycemia with no history DM   P:   Monitor glucose on chemistry  NEUROLOGIC A:   Acute R MCA infarct s/p tpa and IR revascularization   P:   RASS goal: -1 to -2 Propofol infusion Neurology primary > management per them   FAMILY  - Updates:   - Inter-disciplinary family meet or Palliative Care meeting due by:  6/29   Joneen Roach, AGACNP-BC [place] [facility] Pager [phone] or [phone]  February 09, 2016 9:51 PM

## 2016-01-28 NOTE — Anesthesia Procedure Notes (Signed)
Procedure Name: Intubation Date/Time: 01/19/2016 7:18 PM Performed by: Molli HazardGORDON, Carmine Carrozza M Pre-anesthesia Checklist: Patient identified, Emergency Drugs available, Suction available, Patient being monitored and Timeout performed Patient Re-evaluated:Patient Re-evaluated prior to inductionOxygen Delivery Method: Circle system utilized Preoxygenation: Pre-oxygenation with 100% oxygen Intubation Type: IV induction, Rapid sequence and Cricoid Pressure applied Laryngoscope Size: Miller and 2 Grade View: Grade I Tube type: Subglottic suction tube Tube size: 7.5 mm Number of attempts: 1 Airway Equipment and Method: Stylet Placement Confirmation: ETT inserted through vocal cords under direct vision,  positive ETCO2 and breath sounds checked- equal and bilateral Secured at: 22 cm Tube secured with: Tape Dental Injury: Teeth and Oropharynx as per pre-operative assessment

## 2016-01-28 NOTE — ED Notes (Signed)
EMS states family said patient was at home with son and fell down, unable to talk. Called EMS at 1600. EMS arrived, patient alert and orientedx 4, slurred speech. Unable to grip with left hand, neglects left side, left arm flaccid. No left leg flexion, left side facial droop. EMS placed IV in right hand, placed patient on o2 for oxygen sat in 90s. Patient arrives to ED alert and oriented x4. Went to CT.

## 2016-01-28 NOTE — ED Provider Notes (Signed)
CSN: [id number]     Arrival date & time Feb 05, 2016  1751 History   First MD Initiated Contact with Patient 02/05/2016 1753     Chief Complaint  Patient presents with  . Code Stroke    An emergency department physician performed an initial assessment on this suspected stroke patient at 77.  HPI Comments: 40 y.o. Female presents via EMS as a code stroke. Was home with son, fell down and was unable to talk. EMS was called at 1600. Has left sided facial droop, slurred speech, left arm and leg paralysis.   Patient is a 40 y.o. female presenting with Acute Neurological Problem. The history is provided by the EMS personnel.  Cerebrovascular Accident This is a new problem. The current episode started today (Just before 1600). The problem occurs constantly. The problem has been unchanged. Associated symptoms comments: Denies pain anywhere. Nothing aggravates the symptoms. She has tried nothing for the symptoms. Improvement on treatment: n/a.    Past Medical History  Diagnosis Date  . S/P aortic valve replacement with bioprosthetic valve   . Stroke [facility])    Past Surgical History  Procedure Laterality Date  . Carbomedic top hat supra-annular aortic valve    . Radiology with anesthesia N/A 10/08/2014    Procedure: RADIOLOGY WITH ANESTHESIA;  Surgeon: Oneal Grout, MD;  Location: [facility] OR;  Service: Radiology;  Laterality: N/A;   No family history on file. Social History  Substance Use Topics  . Smoking status: Never Smoker   . Smokeless tobacco: None  . Alcohol Use: No   OB History    No data available     Review of Systems  Unable to perform ROS: Acuity of condition   Patient denies pain anywhere.    Allergies  Warfarin sodium and Hydrocodone  Home Medications   Prior to Admission medications   Medication Sig Start Date End Date Taking? Authorizing Provider  amitriptyline (ELAVIL) 25 MG tablet Take 1 tablet (25 mg total) by mouth at bedtime. Reported on 08/27/2015 08/27/15  08/26/16  Nilda Riggs, NP  pravastatin (PRAVACHOL) 20 MG tablet Take 1 tablet (20 mg total) by mouth daily at 6 PM. Patient not taking: Reported on 08/27/2015 10/13/14   Micki Riley, MD  topiramate (TOPAMAX) 25 MG tablet Take 1 tablet (25 mg total) by mouth 2 (two) times daily. Patient taking differently: Take 25 mg by mouth 2 (two) times daily as needed.  02/11/15   Micki Riley, MD  warfarin (COUMADIN) 4 MG tablet daily 01/08/15   Historical Provider, MD   BP 138/65 mmHg  Pulse 71  Resp 16  Ht  (1.6 m)  Wt 98.6 kg  BMI 38.52 kg/m2  SpO2 100%  LMP 12/28/2015 Physical Exam  Constitutional: She appears well-developed and well-nourished. No distress.  HENT:  Head: Normocephalic and atraumatic.  Right Ear: External ear normal.  Left Ear: External ear normal.  Neck: Normal range of motion.  Cardiovascular: Normal rate and regular rhythm.   Pulmonary/Chest: Effort normal and breath sounds normal.  Abdominal: Soft. Bowel sounds are normal. She exhibits no distension. There is no tenderness.  Neurological: She is alert.  Left lower facial droop, speech slurred but appropriate, gaze deviated to right; no movement of LUE, LLE; normal strength and movement of RUE and RLE  Skin: Skin is warm and dry. She is not diaphoretic.    ED Course  Procedures  Labs Review Labs Reviewed  CBC WITH DIFFERENTIAL/PLATELET - Abnormal; Notable for the following:  WBC 19.9 (*)    RBC 5.25 (*)    MCH 24.2 (*)    RDW 16.1 (*)    Neutro Abs 17.5 (*)    All other components within normal limits  PROTIME-INR - Abnormal; Notable for the following:    Prothrombin Time 18.6 (*)    INR 1.55 (*)    All other components within normal limits  I-STAT CHEM 8, ED - Abnormal; Notable for the following:    Glucose, Bld 123 (*)    All other components within normal limits  CBG MONITORING, ED - Abnormal; Notable for the following:    Glucose-Capillary 106 (*)    All other components within normal  limits  APTT  ETHANOL  COMPREHENSIVE METABOLIC PANEL  TROPONIN I  URINE RAPID DRUG SCREEN, HOSP PERFORMED  URINALYSIS, ROUTINE W REFLEX MICROSCOPIC ([facility])  Rosezena SensorI-STAT TROPOININ, ED    Imaging Review Ct Angio Head W Or Wo Contrast  03-28-16  CLINICAL DATA:  40 year old female with mechanical heart valve. Code stroke with left side weakness. Hyperdense right MCA on noncontrast head CT. Left MCA infarcts demonstrated by MRI in 2016. Initial encounter. EXAM: CT ANGIOGRAPHY HEAD AND NECK TECHNIQUE: Multidetector CT imaging of the head and neck was performed using the standard protocol during bolus administration of intravenous contrast. Multiplanar CT image reconstructions and MIPs were obtained to evaluate the vascular anatomy. Carotid stenosis measurements (when applicable) are obtained utilizing NASCET criteria, using the distal internal carotid diameter as the denominator. CONTRAST:  50 mL Isovue-300 COMPARISON:  Head CT without contrast 1754 hours today, 10/09/2014 brain MRI and intracranial MRA. FINDINGS: CT HEAD Reported separately. CTA NECK Skeleton: Partially visible sequelae of median sternotomy. No acute osseous abnormality identified. Other neck: Negative lung apices. No superior mediastinal lymphadenopathy. Thyroid, larynx, pharynx, parapharyngeal spaces, retropharyngeal space, sublingual space, submandibular glands, parotid glands, and cervical lymph nodes are within normal limits. Aortic arch: 3 vessel arch configuration. No arch atherosclerosis or great vessel origin stenosis. Right carotid system: Mild motion artifact in the lower neck. Negative right CCA. Negative right carotid bifurcation. Negative cervical right ICA. Left carotid system: Mild motion artifact at the level of the lower left CCA which appears to remain normal. Left carotid bifurcation appears widely patent, but distal to the bulb the cervical left ICA is diminutive and remain so to the level of the skullbase. Left ICA  caliber is about half that on the right throughout the neck. Vertebral arteries:No proximal subclavian artery stenosis. Normal vertebral artery origins, the right vertebral artery origin is somewhat early. Both vertebral arteries are patent to the skullbase without stenosis. CTA HEAD Posterior circulation: Codominant distal vertebral arteries are normal to the vertebrobasilar junction. Normal right PICA origin. Normal AICA origins. No basilar stenosis. SCA and PCA origins are normal. Diminutive posterior communicating arteries. Bilateral PCA branches are within normal limits. Anterior circulation: Diminutive left ICA siphon remains patent to the anterior genu, but the left ICA terminus appears functionally occluded. There are small left lenticulostriate and left MCA M2 region collaterals, but with little overall left MCA region enhancement. The left ACA A1 segment is chronically diminutive or absent. The right ICA siphon is patent to the right ICA terminus. The right ACA origin is normal. The left ACA is chronically or congenitally supplied from the right side. There is mild motion artifact along the proximal ACA is which appear patent. The distal ACA branches appear within normal limits. Right MCA origin is patent but the right M1 is occluded 11  mm beyond its origin (see series 407, image 45). There is some right M2 collateral enhancement in the sylvian fissure. Anatomic variants: Diminutive or absent left ACA A1 segment such that both ACA is are supplied from the right. IMPRESSION: 1. Positive for emergent large vessel occlusion: Right MCA M1 segment. Some collateral enhancement visible in the right sylvian fissure. This was discussed by telephone with Dr. Ritta SlotMCNEILL KIRKPATRICK on 12-Nov-2015 At 1820 hours. 2. Occlusion of the left ICA terminus since the MRA on 10/09/2014. Faint collateral enhancement in the left lenticulostriate and left MCA territory. 3. Normal appearing bilateral ACAs, the left is supplied from the  right due to diminutive or absent left A1. 4. No atherosclerosis or stenosis in the neck. Negative posterior circulation. Electronically Signed   By: Odessa FlemingH  Hall M.D.   On: 006-Apr-2017 18:47   Ct Head Wo Contrast  12-Nov-2015  ADDENDUM REPORT: 006-Apr-2017 18:49 ADDENDUM: With regard to impression # 4, the following correction is noted: The chronic appearing bilateral ACA territory infarcts have occurred since March 2016. (not March 2017) Electronically Signed   By: Odessa FlemingH  Hall M.D.   On: 006-Apr-2017 18:49  12-Nov-2015  CLINICAL DATA:  40 year old female code stroke. Left side weakness last seen normal at 1600 hours. Initial encounter. EXAM: CT HEAD WITHOUT CONTRAST TECHNIQUE: Contiguous axial images were obtained from the base of the skull through the vertex without intravenous contrast. COMPARISON:  CTA head and neck from today reported separately. FINDINGS: Hyperdense right MCA M1 (series 201, image 9). No CT changes of acute right MCA territory infarct. See CTA findings today reported separately. ASPECTS score = 10, with regard to the right MCA territory. [place] Stroke Program Early CT Score Normal score = 10 New bilateral ACA territory encephalomalacia since 10/09/2014. Mild posterior insula left MCA territory encephalomalacia corresponding to the March ischemia. Posterior fossa gray-white matter differentiation remains normal. No acute intracranial hemorrhage identified. No midline shift, mass effect, or evidence of intracranial mass lesion. No ventriculomegaly. No acute osseous abnormality identified. Stable and well pneumatized visualized paranasal sinuses and mastoids. Rightward gaze deviation. Negative scalp soft tissues. IMPRESSION: 1. Hyperdense right M1 segment, see CTA findings reported separately. 2. No CT changes of right MCA infarct identified; ASPECTS = 10. No acute intracranial hemorrhage. 3. The above was discussed by telephone with Dr. Ritta SlotMcNeill Kirkpatrick on 12-Nov-2015 at 1820 hours. 4. New but chronic  appearing bilateral anterior ACA territory infarcts since March. Expected evolution of the left MCA insula infarcts seen at that time. Electronically Signed: By: Odessa FlemingH  Hall M.D. On: 006-Apr-2017 18:27   Ct Angio Neck W Or Wo Contrast  12-Nov-2015  CLINICAL DATA:  40 year old female with mechanical heart valve. Code stroke with left side weakness. Hyperdense right MCA on noncontrast head CT. Left MCA infarcts demonstrated by MRI in 2016. Initial encounter. EXAM: CT ANGIOGRAPHY HEAD AND NECK TECHNIQUE: Multidetector CT imaging of the head and neck was performed using the standard protocol during bolus administration of intravenous contrast. Multiplanar CT image reconstructions and MIPs were obtained to evaluate the vascular anatomy. Carotid stenosis measurements (when applicable) are obtained utilizing NASCET criteria, using the distal internal carotid diameter as the denominator. CONTRAST:  50 mL Isovue-300 COMPARISON:  Head CT without contrast 1754 hours today, 10/09/2014 brain MRI and intracranial MRA. FINDINGS: CT HEAD Reported separately. CTA NECK Skeleton: Partially visible sequelae of median sternotomy. No acute osseous abnormality identified. Other neck: Negative lung apices. No superior mediastinal lymphadenopathy. Thyroid, larynx, pharynx, parapharyngeal spaces, retropharyngeal space, sublingual space, submandibular glands,  parotid glands, and cervical lymph nodes are within normal limits. Aortic arch: 3 vessel arch configuration. No arch atherosclerosis or great vessel origin stenosis. Right carotid system: Mild motion artifact in the lower neck. Negative right CCA. Negative right carotid bifurcation. Negative cervical right ICA. Left carotid system: Mild motion artifact at the level of the lower left CCA which appears to remain normal. Left carotid bifurcation appears widely patent, but distal to the bulb the cervical left ICA is diminutive and remain so to the level of the skullbase. Left ICA caliber is  about half that on the right throughout the neck. Vertebral arteries:No proximal subclavian artery stenosis. Normal vertebral artery origins, the right vertebral artery origin is somewhat early. Both vertebral arteries are patent to the skullbase without stenosis. CTA HEAD Posterior circulation: Codominant distal vertebral arteries are normal to the vertebrobasilar junction. Normal right PICA origin. Normal AICA origins. No basilar stenosis. SCA and PCA origins are normal. Diminutive posterior communicating arteries. Bilateral PCA branches are within normal limits. Anterior circulation: Diminutive left ICA siphon remains patent to the anterior genu, but the left ICA terminus appears functionally occluded. There are small left lenticulostriate and left MCA M2 region collaterals, but with little overall left MCA region enhancement. The left ACA A1 segment is chronically diminutive or absent. The right ICA siphon is patent to the right ICA terminus. The right ACA origin is normal. The left ACA is chronically or congenitally supplied from the right side. There is mild motion artifact along the proximal ACA is which appear patent. The distal ACA branches appear within normal limits. Right MCA origin is patent but the right M1 is occluded 11 mm beyond its origin (see series 407, image 45). There is some right M2 collateral enhancement in the sylvian fissure. Anatomic variants: Diminutive or absent left ACA A1 segment such that both ACA is are supplied from the right. IMPRESSION: 1. Positive for emergent large vessel occlusion: Right MCA M1 segment. Some collateral enhancement visible in the right sylvian fissure. This was discussed by telephone with Dr. Ritta Slot on 01/19/2016 At 1820 hours. 2. Occlusion of the left ICA terminus since the MRA on 10/09/2014. Faint collateral enhancement in the left lenticulostriate and left MCA territory. 3. Normal appearing bilateral ACAs, the left is supplied from the right due  to diminutive or absent left A1. 4. No atherosclerosis or stenosis in the neck. Negative posterior circulation. Electronically Signed   By: Odessa Fleming M.D.   On: 01/26/2016 18:47   I have personally reviewed and evaluated these images and lab results as part of my medical decision-making.   EKG Interpretation None      MDM   Final diagnoses:  Stroke (cerebrum) (HCC)   41 y.o. female presents as a code stroke. Deficits include left lower facial droop, LUE and LLE weakness, and slurred speech. Began just prior to 1600 today. Neurology present on arrival of patient. ABCs intact. Went to CT scanner where imaging revealed a large acute M1 cutoff. TPA will be given, patient will go to interventional radiology, and neurology will admit the patient.  Case managed in conjunction with my attending, Dr. Jeraldine Loots.    Maxine Glenn, MD 01/13/2016 Kristopher Oppenheim  Gerhard Munch, MD 01/29/16 [phone]

## 2016-01-28 NOTE — Anesthesia Postprocedure Evaluation (Signed)
Anesthesia Post Note  Patient: Stephenie AcresKelly Shiveley  Procedure(s) Performed: Procedure(s) (LRB): RADIOLOGY WITH ANESTHESIA (N/A)  Patient location during evaluation: NICU Anesthesia Type: General Level of consciousness: patient remains intubated per anesthesia plan Pain management: pain level controlled Vital Signs Assessment: post-procedure vital signs reviewed and stable Respiratory status: patient on ventilator - see flowsheet for VS and patient remains intubated per anesthesia plan Cardiovascular status: blood pressure returned to baseline Anesthetic complications: no    Last Vitals:  Filed Vitals:   07/26/2016 1900 07/26/2016 2129  BP: 132/79 148/70  Pulse: 83 68  Resp: 21 24    Last Pain: There were no vitals filed for this visit.               Consepcion Utt COKER

## 2016-01-28 NOTE — Anesthesia Preprocedure Evaluation (Addendum)
Anesthesia Evaluation  Patient identified by MRN, date of birth, ID band Patient awake  General Assessment Comment:Follows commands, nonverbal, restless   Reviewed: Allergy & Precautions, Patient's Chart, lab work & pertinent test results  Airway Mallampati: II  TM Distance: >3 FB Neck ROM: Full    Dental  (+) Teeth Intact   Pulmonary    breath sounds clear to auscultation       Cardiovascular  Rhythm:Regular Rate:Normal  Hx mechanical aortic valve replacement.   Neuro/Psych CVA    GI/Hepatic   Endo/Other  Morbid obesity  Renal/GU      Musculoskeletal   Abdominal   Peds  Hematology   Anesthesia Other Findings   Reproductive/Obstetrics                            Anesthesia Physical Anesthesia Plan  ASA: IV and emergent  Anesthesia Plan: General   Post-op Pain Management:    Induction: Intravenous, Rapid sequence and Cricoid pressure planned  Airway Management Planned: Oral ETT  Additional Equipment: Arterial line  Intra-op Plan:   Post-operative Plan: Post-operative intubation/ventilation  Informed Consent: I have reviewed the patients History and Physical, chart, labs and discussed the procedure including the risks, benefits and alternatives for the proposed anesthesia with the patient or authorized representative who has indicated his/her understanding and acceptance.     Plan Discussed with: CRNA and Anesthesiologist  Anesthesia Plan Comments:        Anesthesia Quick Evaluation
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
## 2016-01-28 NOTE — Progress Notes (Signed)
eLink Physician-Brief Progress Note Patient Name: Nichole Black DOB: 1976-03-29 MRN: [id number]   Date of Service  03-29-16  HPI/Events of Note  Camera check on patient with tachycardia & HTN post procedure. On Propofol gtt at 80 & eyes closed. Nurse at bedside. Sinus tach on telemetry.  eICU Interventions  1. Starting Fentanyl gtt & bolus prn 2. Continue ICU & telemetry monitoring     Intervention Category Major Interventions: Arrhythmia - evaluation and management  Lawanda CousinsJennings Jarman Litton 03-29-16, 11:49 PM

## 2016-01-29 ENCOUNTER — Inpatient Hospital Stay (HOSPITAL_COMMUNITY): Payer: 59

## 2016-01-29 ENCOUNTER — Encounter (HOSPITAL_COMMUNITY): Payer: Self-pay | Admitting: Interventional Radiology

## 2016-01-29 DIAGNOSIS — I6789 Other cerebrovascular disease: Secondary | ICD-10-CM

## 2016-01-29 DIAGNOSIS — Z8673 Personal history of transient ischemic attack (TIA), and cerebral infarction without residual deficits: Secondary | ICD-10-CM

## 2016-01-29 DIAGNOSIS — J9601 Acute respiratory failure with hypoxia: Secondary | ICD-10-CM | POA: Insufficient documentation

## 2016-01-29 DIAGNOSIS — I63412 Cerebral infarction due to embolism of left middle cerebral artery: Secondary | ICD-10-CM

## 2016-01-29 LAB — MRSA PCR SCREENING: MRSA BY PCR: NEGATIVE

## 2016-01-29 LAB — ECHOCARDIOGRAM COMPLETE
AV Mean grad: 21 mmHg
AV Peak grad: 38 mmHg
AV area mean vel ind: 0.67 cm2/m2
AVAREAMEANV: 1.41 cm2
AVAREAVTI: 1.4 cm2
AVAREAVTIIND: 0.68 cm2/m2
AVCELMEANRAT: 0.56
AVLVOTPG: 12 mmHg
AVPKVEL: 309 cm/s
Ao pk vel: 0.55 m/s
CHL CUP AV PEAK INDEX: 0.66
CHL CUP AV VALUE AREA INDEX: 0.68
CHL CUP AV VEL: 1.45
CHL CUP MV DEC (S): 239
DOP CAL AO MEAN VELOCITY: 212 cm/s
EERAT: 10.06
EWDT: 239 ms
FS: 41 % (ref 28–44)
HEIGHTINCHES: 63 in
IV/PV OW: 0.78
LA diam end sys: 25 mm
LA diam index: 1.18 cm/m2
LA vol A4C: 44.9 ml
LA vol index: 18.1 mL/m2
LASIZE: 25 mm
LAVOL: 38.5 mL
LDCA: 2.54 cm2
LV E/e' medial: 10.06
LV E/e'average: 10.06
LV SIMPSON'S DISK: 71
LV dias vol index: 39 mL/m2
LV sys vol index: 12 mL/m2
LV sys vol: 25 mL (ref 14–42)
LVDIAVOL: 83 mL (ref 46–106)
LVELAT: 9.25 cm/s
LVOT SV: 86 mL
LVOT VTI: 33.7 cm
LVOT peak VTI: 0.57 cm
LVOTD: 18 mm
LVOTPV: 170 cm/s
MV pk E vel: 93.1 m/s
MVPG: 3 mmHg
MVPKAVEL: 76.6 m/s
PW: 14.7 mm — AB (ref 0.6–1.1)
RV LATERAL S' VELOCITY: 12.2 cm/s
RV TAPSE: 25.3 mm
Stroke v: 59 ml
TDI e' lateral: 9.25
TDI e' medial: 7.62
VTI: 59.1 cm
Valve area: 1.45 cm2
WEIGHTICAEL: 3425.07 [oz_av]

## 2016-01-29 LAB — BASIC METABOLIC PANEL
Anion gap: 5 (ref 5–15)
BUN: 8 mg/dL (ref 6–20)
CALCIUM: 8.5 mg/dL — AB (ref 8.9–10.3)
CO2: 22 mmol/L (ref 22–32)
CREATININE: 0.74 mg/dL (ref 0.44–1.00)
Chloride: 110 mmol/L (ref 101–111)
GFR calc non Af Amer: >60 mL/min (ref >60)
Glucose, Bld: 113 mg/dL — ABNORMAL HIGH (ref 65–99)
Potassium: 3.6 mmol/L (ref 3.5–5.1)
SODIUM: 137 mmol/L (ref 135–145)

## 2016-01-29 LAB — LIPID PANEL
CHOL/HDL RATIO: 6.4 ratio
Cholesterol: 198 mg/dL (ref 0–200)
HDL: 31 mg/dL — AB (ref >40)
LDL CALC: 107 mg/dL — AB (ref 0–99)
Triglycerides: 300 mg/dL — ABNORMAL HIGH (ref <150)
VLDL: 60 mg/dL — AB (ref 0–40)

## 2016-01-29 LAB — CBC WITH DIFFERENTIAL/PLATELET
BASOS ABS: 0 10*3/uL (ref 0.0–0.1)
BASOS PCT: 0 %
EOS ABS: 0 10*3/uL (ref 0.0–0.7)
Eosinophils Relative: 0 %
HCT: 34.3 % — ABNORMAL LOW (ref 36.0–46.0)
Hemoglobin: 10.4 g/dL — ABNORMAL LOW (ref 12.0–15.0)
Lymphocytes Relative: 11 %
Lymphs Abs: 1.5 10*3/uL (ref 0.7–4.0)
MCH: 23.6 pg — ABNORMAL LOW (ref 26.0–34.0)
MCHC: 30.3 g/dL (ref 30.0–36.0)
MCV: 78 fL (ref 78.0–100.0)
MONO ABS: 0.4 10*3/uL (ref 0.1–1.0)
MONOS PCT: 3 %
NEUTROS ABS: 11.8 10*3/uL — AB (ref 1.7–7.7)
NEUTROS PCT: 86 %
PLATELETS: 289 10*3/uL (ref 150–400)
RBC: 4.4 MIL/uL (ref 3.87–5.11)
RDW: 16.5 % — AB (ref 11.5–15.5)
WBC: 13.7 10*3/uL — ABNORMAL HIGH (ref 4.0–10.5)

## 2016-01-29 MED ORDER — CHLORHEXIDINE GLUCONATE 0.12% ORAL RINSE (MEDLINE KIT)
15.0000 mL | Freq: Two times a day (BID) | OROMUCOSAL | Status: DC
  Administered 2016-01-29 – 2016-01-30 (×4): 15 mL via OROMUCOSAL

## 2016-01-29 MED ORDER — IOPAMIDOL (ISOVUE-370) INJECTION 76%
50.0000 mL | Freq: Once | INTRAVENOUS | Status: AC | PRN
  Administered 2016-01-28: 50 mL via INTRAVENOUS

## 2016-01-29 MED ORDER — SODIUM CHLORIDE 0.9 % IV SOLN
INTRAVENOUS | Status: DC
  Administered 2016-01-29: 125 mL/h via INTRAVENOUS
  Administered 2016-01-30 – 2016-01-31 (×2): via INTRAVENOUS
  Administered 2016-01-31: 125 mL/h via INTRAVENOUS
  Administered 2016-02-01 – 2016-02-03 (×5): via INTRAVENOUS

## 2016-01-29 MED ORDER — ANTISEPTIC ORAL RINSE SOLUTION (CORINZ)
7.0000 mL | OROMUCOSAL | Status: DC
  Administered 2016-01-29 – 2016-01-30 (×17): 7 mL via OROMUCOSAL

## 2016-01-29 MED ORDER — HEPARIN (PORCINE) IN NACL 100-0.45 UNIT/ML-% IJ SOLN
1100.0000 [IU]/h | INTRAMUSCULAR | Status: DC
  Administered 2016-01-29: 750 [IU]/h via INTRAVENOUS
  Filled 2016-01-29 (×3): qty 250

## 2016-01-29 NOTE — Progress Notes (Signed)
ANTICOAGULATION CONSULT NOTE - Initial Consult  Pharmacy Consult for Heparin Indication: stroke and mechanical AVR  Allergies  Allergen Reactions  . Warfarin Sodium Itching and Other (See Comments)    Cannot take Generic, only brand name  . Hydrocodone Itching    Patient Measurements: Height: 5\' 3"  (160 cm) Weight: 214 lb 1.1 oz (97.1 kg) IBW/kg (Calculated) : 52.4 Heparin Dosing Weight: 76 kg  Vital Signs: Temp: 98.5 F (36.9 C) (06/23 2000) Temp Source: Axillary (06/23 2000) BP: 110/65 mmHg (06/23 2000) Pulse Rate: 72 (06/23 2000)  Labs:  Recent Labs  January 10, 2016 1808 January 10, 2016 1810 01/29/16 0315  HGB 14.6 12.7 10.4*  HCT 43.0 41.2 34.3*  PLT  --  290 289  APTT  --  29  --   LABPROT  --  18.6*  --   INR  --  1.55*  --   CREATININE 0.80 0.91 0.74  TROPONINI  --  0.05*  --     Estimated Creatinine Clearance: 104.8 mL/min (by C-G formula based on Cr of 0.74).   Medical History: Past Medical History  Diagnosis Date  . S/P aortic valve replacement with bioprosthetic valve   . [facility]    Assessment:   40 yr old female admitted 6/22 with stroke.  Hx mechanical AVR and prior stroke;  INR was only 1.55 on admission.  tPA given on 6/22 at 1834, then had revascularization of occluded R MCA in IR.  Sheaths removed this morning.   Orders received to begin IV heparin ~7pm. Noted small amount of possible SAH on MRI. Spoke with Dr. Petra KubaKilpatrick earlier, who instructed Rx to await further instructions from Dr. Roseanne RenoStewart after head CT.  RN now reports that Dr. Roseanne RenoStewart would like heparin drip to begin tonight.  Goal of Therapy:  Heparin level 0.3-0.5 units/ml Monitor platelets by anticoagulation protocol: Yes   Plan:   Begin heparin conservatively at 750 units/hr (~10 units/kg of adjusted body weight/hr)  Heparin level ~ 6hrs after drip begins.  Daily heparin level and CBC while on heparin.  Monitor for any bleeding.  Dennie FettersEgan, Adriyanna Christians [place], [place] Pager:  [phone] 01/29/2016,9:38 PM

## 2016-01-29 NOTE — Progress Notes (Signed)
Pt transported to MRI and back on vent with no complications noted.

## 2016-01-29 NOTE — Progress Notes (Signed)
PULMONARY / CRITICAL CARE MEDICINE   Name: Nichole Black MRN: [id number] DOB: 10-Aug-1975    ADMISSION DATE:  16-Jan-2016 CONSULTATION DATE:  6/22  REFERRING MD:  Amada JupiterKirkpatrick  CHIEF COMPLAINT:  L sided weakness  HISTORY OF PRESENT ILLNESS:  Patient is encephalopathic and/or intubated. Therefore history has been obtained from chart review.  40 year old female with PMH as below, which is significant for aortic valve replacement and CVA. She had repotedly been recovering wel from prior stroke. 6/22 she was having an argument at home with son when she developed acute onset L sided weakness and suffered a fall. She was last known well until around 3:30-4pm at which point she went to sleep. Her husband went to wake her up around 430 and found her on floor. He called 911 and she was transported to ED where she was given tpa for suspected CVA. Ct angiogram showed MCA infarct which is thought to be embolic in nature. She was then taken to IR for intervention and recovered in ICU on ventilator. PCCM to assist with medical management.    SUBJECTIVE:  Had tachycardia overnight controlled with sedation.  comfortable now. Sedated.    VITAL SIGNS: BP 112/81 mmHg  Pulse 81  Temp(Src) 99.1 F (37.3 C) (Oral)  Resp 11  Ht 5\' 3"  (1.6 m)  Wt 97.1 kg (214 lb 1.1 oz)  BMI 37.93 kg/m2  SpO2 100%  LMP 12/28/2015  HEMODYNAMICS:    VENTILATOR SETTINGS: Vent Mode:  [-] PRVC FiO2 (%):  [40 %-100 %] 40 % Set Rate:  [18 bmp] 18 bmp Vt Set:  [420 mL] 420 mL PEEP:  [5 cmH20] 5 cmH20 Plateau Pressure:  [10 cmH20-15 cmH20] 13 cmH20  INTAKE / OUTPUT: I/O last 3 completed shifts: In: 1813.9 [I.V.:1763.9; IV Piggyback:50] Out: 1750 [Urine:1650; Blood:100]  PHYSICAL EXAMINATION:  General:  Obese female in NAD on vent Neuro:  Sedated HEENT:  [place]/AT, PERRL, no JVD Cardiovascular:  RRR, 2/6 SEM and click Lungs:  Clear bilateral breath sounds Abdomen:  Soft, non-distended Musculoskeletal:  No acute  deformity Skin:  Grossly intact  LABS:  BMET  Recent Labs Lab 10/21/15 1808 10/21/15 1810 01/29/16 0315  NA 141 139 137  K 3.9 3.9 3.6  CL 106 107 110  CO2  --  23 22  BUN 12 10 8   CREATININE 0.80 0.91 0.74  GLUCOSE 123* 124* 113*    Electrolytes  Recent Labs Lab 10/21/15 1810 01/29/16 0315  CALCIUM 9.7 8.5*    CBC  Recent Labs Lab 10/21/15 1808 10/21/15 1810 01/29/16 0315  WBC  --  19.9* 13.7*  HGB 14.6 12.7 10.4*  HCT 43.0 41.2 34.3*  PLT  --  290 289    Coag's  Recent Labs Lab 10/21/15 1810  APTT 29  INR 1.55*    Sepsis Markers No results for input(s): LATICACIDVEN, PROCALCITON, O2SATVEN in the last 168 hours.  ABG  Recent Labs Lab 10/21/15 2200  PHART 7.375  PCO2ART 39.1  PO2ART 429*    Liver Enzymes  Recent Labs Lab 10/21/15 1810  AST 33  ALT 28  ALKPHOS 81  BILITOT 0.5  ALBUMIN 4.2    Cardiac Enzymes  Recent Labs Lab 10/21/15 1810  TROPONINI 0.05*    Glucose  Recent Labs Lab 10/21/15 1828  GLUCAP 106*    Imaging Ct Angio Head W Or Wo Contrast  16-Jan-2016  CLINICAL DATA:  40 year old female with mechanical heart valve. Code stroke with left side weakness. Hyperdense right MCA on noncontrast  head CT. Left MCA infarcts demonstrated by MRI in 2016. Initial encounter. EXAM: CT ANGIOGRAPHY HEAD AND NECK TECHNIQUE: Multidetector CT imaging of the head and neck was performed using the standard protocol during bolus administration of intravenous contrast. Multiplanar CT image reconstructions and MIPs were obtained to evaluate the vascular anatomy. Carotid stenosis measurements (when applicable) are obtained utilizing NASCET criteria, using the distal internal carotid diameter as the denominator. CONTRAST:  50 mL Isovue-300 COMPARISON:  Head CT without contrast 1754 hours today, 10/09/2014 brain MRI and intracranial MRA. FINDINGS: CT HEAD Reported separately. CTA NECK Skeleton: Partially visible sequelae of median sternotomy.  No acute osseous abnormality identified. Other neck: Negative lung apices. No superior mediastinal lymphadenopathy. Thyroid, larynx, pharynx, parapharyngeal spaces, retropharyngeal space, sublingual space, submandibular glands, parotid glands, and cervical lymph nodes are within normal limits. Aortic arch: 3 vessel arch configuration. No arch atherosclerosis or great vessel origin stenosis. Right carotid system: Mild motion artifact in the lower neck. Negative right CCA. Negative right carotid bifurcation. Negative cervical right ICA. Left carotid system: Mild motion artifact at the level of the lower left CCA which appears to remain normal. Left carotid bifurcation appears widely patent, but distal to the bulb the cervical left ICA is diminutive and remain so to the level of the skullbase. Left ICA caliber is about half that on the right throughout the neck. Vertebral arteries:No proximal subclavian artery stenosis. Normal vertebral artery origins, the right vertebral artery origin is somewhat early. Both vertebral arteries are patent to the skullbase without stenosis. CTA HEAD Posterior circulation: Codominant distal vertebral arteries are normal to the vertebrobasilar junction. Normal right PICA origin. Normal AICA origins. No basilar stenosis. SCA and PCA origins are normal. Diminutive posterior communicating arteries. Bilateral PCA branches are within normal limits. Anterior circulation: Diminutive left ICA siphon remains patent to the anterior genu, but the left ICA terminus appears functionally occluded. There are small left lenticulostriate and left MCA M2 region collaterals, but with little overall left MCA region enhancement. The left ACA A1 segment is chronically diminutive or absent. The right ICA siphon is patent to the right ICA terminus. The right ACA origin is normal. The left ACA is chronically or congenitally supplied from the right side. There is mild motion artifact along the proximal ACA is  which appear patent. The distal ACA branches appear within normal limits. Right MCA origin is patent but the right M1 is occluded 11 mm beyond its origin (see series 407, image 45). There is some right M2 collateral enhancement in the sylvian fissure. Anatomic variants: Diminutive or absent left ACA A1 segment such that both ACA is are supplied from the right. IMPRESSION: 1. Positive for emergent large vessel occlusion: Right MCA M1 segment. Some collateral enhancement visible in the right sylvian fissure. This was discussed by telephone with Dr. Ritta Slot on February 16, 2016 At 1820 hours. 2. Occlusion of the left ICA terminus since the MRA on 10/09/2014. Faint collateral enhancement in the left lenticulostriate and left MCA territory. 3. Normal appearing bilateral ACAs, the left is supplied from the right due to diminutive or absent left A1. 4. No atherosclerosis or stenosis in the neck. Negative posterior circulation. Electronically Signed   By: Odessa Fleming M.D.   On: 02-16-16 18:47   Ct Head Wo Contrast  Feb 16, 2016  CLINICAL DATA:  Right MCA occlusion post cerebral angiogram/intervention. EXAM: CT HEAD WITHOUT CONTRAST TECHNIQUE: Contiguous axial images were obtained from the base of the skull through the vertex without intravenous contrast. COMPARISON:  2016/02/16  at 7:54 p.m. as well as 10/08/2014, CTA head with contrast 01/02/16 at 18:07 p.m. FINDINGS: Ventricles and cisterns are within normal. Evidence of patient's known bilateral old ACA territory infarcts. Mild increased density over the right MCA with mild increased density over the right temporal parietal gyri likely due to contrast injection from patient's recent angiographic procedure. No mass, significant mass effect or shift midline structures. No CT changes of acute infarction. Remainder the exam is unchanged IMPRESSION: Mild increased density over the right MCA and mild hyperdensity over the gyri of the right MCA territory compatible with  patient's known MCA occlusion and recent angiographic procedure/intervention. Otherwise, no significant change from recent prior exams. Electronically Signed   By: Elberta Fortisaniel  Boyle M.D.   On: 01/02/16 22:02   Ct Head Wo Contrast  2015/10/17  ADDENDUM REPORT: 01/02/16 18:49 ADDENDUM: With regard to impression # 4, the following correction is noted: The chronic appearing bilateral ACA territory infarcts have occurred since March 2016. (not March 2017) Electronically Signed   By: Odessa FlemingH  Hall M.D.   On: 01/02/16 18:49  2015/10/17  CLINICAL DATA:  40 year old female code stroke. Left side weakness last seen normal at 1600 hours. Initial encounter. EXAM: CT HEAD WITHOUT CONTRAST TECHNIQUE: Contiguous axial images were obtained from the base of the skull through the vertex without intravenous contrast. COMPARISON:  CTA head and neck from today reported separately. FINDINGS: Hyperdense right MCA M1 (series 201, image 9). No CT changes of acute right MCA territory infarct. See CTA findings today reported separately. ASPECTS score = 10, with regard to the right MCA territory. [place] Stroke Program Early CT Score Normal score = 10 New bilateral ACA territory encephalomalacia since 10/09/2014. Mild posterior insula left MCA territory encephalomalacia corresponding to the March ischemia. Posterior fossa gray-white matter differentiation remains normal. No acute intracranial hemorrhage identified. No midline shift, mass effect, or evidence of intracranial mass lesion. No ventriculomegaly. No acute osseous abnormality identified. Stable and well pneumatized visualized paranasal sinuses and mastoids. Rightward gaze deviation. Negative scalp soft tissues. IMPRESSION: 1. Hyperdense right M1 segment, see CTA findings reported separately. 2. No CT changes of right MCA infarct identified; ASPECTS = 10. No acute intracranial hemorrhage. 3. The above was discussed by telephone with Dr. Ritta SlotMcNeill Kirkpatrick on 2015/10/17 at 1820 hours.  4. New but chronic appearing bilateral anterior ACA territory infarcts since March. Expected evolution of the left MCA insula infarcts seen at that time. Electronically Signed: By: Odessa FlemingH  Hall M.D. On: 01/02/16 18:27   Ct Angio Neck W Or Wo Contrast  2015/10/17  CLINICAL DATA:  40 year old female with mechanical heart valve. Code stroke with left side weakness. Hyperdense right MCA on noncontrast head CT. Left MCA infarcts demonstrated by MRI in 2016. Initial encounter. EXAM: CT ANGIOGRAPHY HEAD AND NECK TECHNIQUE: Multidetector CT imaging of the head and neck was performed using the standard protocol during bolus administration of intravenous contrast. Multiplanar CT image reconstructions and MIPs were obtained to evaluate the vascular anatomy. Carotid stenosis measurements (when applicable) are obtained utilizing NASCET criteria, using the distal internal carotid diameter as the denominator. CONTRAST:  50 mL Isovue-300 COMPARISON:  Head CT without contrast 1754 hours today, 10/09/2014 brain MRI and intracranial MRA. FINDINGS: CT HEAD Reported separately. CTA NECK Skeleton: Partially visible sequelae of median sternotomy. No acute osseous abnormality identified. Other neck: Negative lung apices. No superior mediastinal lymphadenopathy. Thyroid, larynx, pharynx, parapharyngeal spaces, retropharyngeal space, sublingual space, submandibular glands, parotid glands, and cervical lymph nodes are within normal limits. Aortic arch:  3 vessel arch configuration. No arch atherosclerosis or great vessel origin stenosis. Right carotid system: Mild motion artifact in the lower neck. Negative right CCA. Negative right carotid bifurcation. Negative cervical right ICA. Left carotid system: Mild motion artifact at the level of the lower left CCA which appears to remain normal. Left carotid bifurcation appears widely patent, but distal to the bulb the cervical left ICA is diminutive and remain so to the level of the skullbase. Left  ICA caliber is about half that on the right throughout the neck. Vertebral arteries:No proximal subclavian artery stenosis. Normal vertebral artery origins, the right vertebral artery origin is somewhat early. Both vertebral arteries are patent to the skullbase without stenosis. CTA HEAD Posterior circulation: Codominant distal vertebral arteries are normal to the vertebrobasilar junction. Normal right PICA origin. Normal AICA origins. No basilar stenosis. SCA and PCA origins are normal. Diminutive posterior communicating arteries. Bilateral PCA branches are within normal limits. Anterior circulation: Diminutive left ICA siphon remains patent to the anterior genu, but the left ICA terminus appears functionally occluded. There are small left lenticulostriate and left MCA M2 region collaterals, but with little overall left MCA region enhancement. The left ACA A1 segment is chronically diminutive or absent. The right ICA siphon is patent to the right ICA terminus. The right ACA origin is normal. The left ACA is chronically or congenitally supplied from the right side. There is mild motion artifact along the proximal ACA is which appear patent. The distal ACA branches appear within normal limits. Right MCA origin is patent but the right M1 is occluded 11 mm beyond its origin (see series 407, image 45). There is some right M2 collateral enhancement in the sylvian fissure. Anatomic variants: Diminutive or absent left ACA A1 segment such that both ACA is are supplied from the right. IMPRESSION: 1. Positive for emergent large vessel occlusion: Right MCA M1 segment. Some collateral enhancement visible in the right sylvian fissure. This was discussed by telephone with Dr. Ritta Slot on 02/19/16 At 1820 hours. 2. Occlusion of the left ICA terminus since the MRA on 10/09/2014. Faint collateral enhancement in the left lenticulostriate and left MCA territory. 3. Normal appearing bilateral ACAs, the left is supplied from  the right due to diminutive or absent left A1. 4. No atherosclerosis or stenosis in the neck. Negative posterior circulation. Electronically Signed   By: Odessa Fleming M.D.   On: 02-19-16 18:47   Dg Chest Port 1 View  2016/02/19  CLINICAL DATA:  Encounter for intubation EXAM: PORTABLE CHEST - 1 VIEW COMPARISON:  10/08/2014 FINDINGS: Endotracheal tube tip is below the thoracic inlet, approximately 6 cm above carina. Low lung volumes. Linear scarring or subsegmental atelectasis at the left lung base stable. Previous median sternotomy and valve surgery. Heart size upper limits normal. Surgical clips in the right axilla. IMPRESSION: 1. Chronic left lower lobe scarring or atelectasis. No acute abnormality. 2. Endotracheal tube placement 6 cm above carina. Electronically Signed   By: Corlis Leak M.D.   On: 2016/02/19 22:18     STUDIES:  CT angiogram head 6/22 > Right MCA M1 segment emergent occlusion, occlusion of the left ICA terminus.   CULTURES: MRSA (-) 6/22  ANTIBIOTICS:   SIGNIFICANT EVENTS: 6/22 admit with CVA. Got tpa and to neuro IR. Then to ICU intubated.  LINES/TUBES: ETT 6/22  DISCUSSION: 40 year old female with PMH of AVR and prior stroke admitted 6/22 for R MCA CVA. She received tpa and IR revascularization. She is in ICU  for recovery on vent. Requiring nicardipine for BP control.  ASSESSMENT / PLAN:  PULMONARY A: Acute Hypoxemic respratory failure 2/2 inability to protect airway/CVA  P:   Full vent support Vent Bundle Start PST/wean once cleared by neuro  CARDIOVASCULAR A:  H/o Aortic valve replacement (bioprosthetic valve)  P:  Telemetry monitoring SBP goal 120-140 per IR Nicardipine infusion and PRN hydralazine Anticoagulation per neurology.  RENAL A:   No acute issues  P:   Follow BMP On IVF  GASTROINTESTINAL A:   No acute issues  P:   Pepcid for SUP NPO  HEMATOLOGIC A:   On chronic warfarin  P:  Follow CBC Anticoagulation per neurology >  start heparin 24 hrs post tpa if OK with neuro. Pt to have cranial ct scan at 4 pm 6/23.  Holding warfarin  INFECTIOUS A:   Leukocytosis  P:   Monitor off ABX Monitor WBC and fever curve  ENDOCRINE A:   Hyperglycemia with no history DM   P:   Monitor glucose on chemistry  NEUROLOGIC A:   Acute R MCA infarct s/p tpa and IR revascularization   P:   RASS goal: -1 to -2 Propofol infusion Neurology primary > management per them. Plan for cranial ct scan today at 4pm   FAMILY  - Updates: Updated husband at bedside.   - Inter-disciplinary family meet or Palliative Care meeting due by:  6/29  Critical care time with this patient today : 32 minutes.  Plan for rpt cranial ct scan today. If no bleed and OK with neuro, need to restart heparin for bioprosthetic valve.   Pollie Meyer, MD 01/29/2016, 10:01 AM [facility] Pager [phone] After 3 pm or if no answer, call [phone]

## 2016-01-29 NOTE — Progress Notes (Signed)
SLP Cancellation Note  Patient Details Name: Nichole Black MRN: [id number] DOB: 06-Apr-1976   Cancelled treatment:       Reason Eval/Treat Not Completed: Patient not medically ready   Brunette Lavalle, Riley NearingBonnie Caroline 01/29/2016, 7:49 AM
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
## 2016-01-29 NOTE — Progress Notes (Signed)
Patient ID: Nichole Black, female   DOB: 1976/05/28, 40 y.o.   MRN: [id number]    Referring Physician(s): Ritta Slot  Supervising Physician: Julieanne Cotton  Patient Status: inpt  Chief Complaint: CVA  Subjective: Patient intubated and sedated  Allergies: Warfarin sodium and Hydrocodone  Medications: Prior to Admission medications   Medication Sig Start Date End Date Taking? Authorizing Provider  amitriptyline (ELAVIL) 25 MG tablet Take 1 tablet (25 mg total) by mouth at bedtime. Reported on 08/27/2015 08/27/15 08/26/16 Yes Nilda Riggs, NP  topiramate (TOPAMAX) 25 MG tablet Take 1 tablet (25 mg total) by mouth 2 (two) times daily. Patient taking differently: Take 25 mg by mouth 2 (two) times daily as needed.  02/11/15  Yes Micki Riley, MD  warfarin (COUMADIN) 4 MG tablet daily 01/08/15  Yes Historical Provider, MD  pravastatin (PRAVACHOL) 20 MG tablet Take 1 tablet (20 mg total) by mouth daily at 6 PM. Patient not taking: Reported on 08/27/2015 10/13/14   Micki Riley, MD    Vital Signs: BP 112/81 mmHg  Pulse 81  Temp(Src) 99.1 F (37.3 C) (Oral)  Resp 11  Ht 5\' 3"  (1.6 m)  Wt 214 lb 1.1 oz (97.1 kg)  BMI 37.93 kg/m2  SpO2 100%  LMP 12/28/2015  Physical Exam: Skin: sheathes currently being removed.   Neuro: unable to assess as she is sedated with propofol on the vent  Imaging: Ct Angio Head W Or Wo Contrast  2016/02/20  CLINICAL DATA:  40 year old female with mechanical heart valve. Code stroke with left side weakness. Hyperdense right MCA on noncontrast head CT. Left MCA infarcts demonstrated by MRI in 2016. Initial encounter. EXAM: CT ANGIOGRAPHY HEAD AND NECK TECHNIQUE: Multidetector CT imaging of the head and neck was performed using the standard protocol during bolus administration of intravenous contrast. Multiplanar CT image reconstructions and MIPs were obtained to evaluate the vascular anatomy. Carotid stenosis measurements (when applicable) are  obtained utilizing NASCET criteria, using the distal internal carotid diameter as the denominator. CONTRAST:  50 mL Isovue-300 COMPARISON:  Head CT without contrast 1754 hours today, 10/09/2014 brain MRI and intracranial MRA. FINDINGS: CT HEAD Reported separately. CTA NECK Skeleton: Partially visible sequelae of median sternotomy. No acute osseous abnormality identified. Other neck: Negative lung apices. No superior mediastinal lymphadenopathy. Thyroid, larynx, pharynx, parapharyngeal spaces, retropharyngeal space, sublingual space, submandibular glands, parotid glands, and cervical lymph nodes are within normal limits. Aortic arch: 3 vessel arch configuration. No arch atherosclerosis or great vessel origin stenosis. Right carotid system: Mild motion artifact in the lower neck. Negative right CCA. Negative right carotid bifurcation. Negative cervical right ICA. Left carotid system: Mild motion artifact at the level of the lower left CCA which appears to remain normal. Left carotid bifurcation appears widely patent, but distal to the bulb the cervical left ICA is diminutive and remain so to the level of the skullbase. Left ICA caliber is about half that on the right throughout the neck. Vertebral arteries:No proximal subclavian artery stenosis. Normal vertebral artery origins, the right vertebral artery origin is somewhat early. Both vertebral arteries are patent to the skullbase without stenosis. CTA HEAD Posterior circulation: Codominant distal vertebral arteries are normal to the vertebrobasilar junction. Normal right PICA origin. Normal AICA origins. No basilar stenosis. SCA and PCA origins are normal. Diminutive posterior communicating arteries. Bilateral PCA branches are within normal limits. Anterior circulation: Diminutive left ICA siphon remains patent to the anterior genu, but the left ICA terminus appears functionally occluded. There are small  left lenticulostriate and left MCA M2 region collaterals, but  with little overall left MCA region enhancement. The left ACA A1 segment is chronically diminutive or absent. The right ICA siphon is patent to the right ICA terminus. The right ACA origin is normal. The left ACA is chronically or congenitally supplied from the right side. There is mild motion artifact along the proximal ACA is which appear patent. The distal ACA branches appear within normal limits. Right MCA origin is patent but the right M1 is occluded 11 mm beyond its origin (see series 407, image 45). There is some right M2 collateral enhancement in the sylvian fissure. Anatomic variants: Diminutive or absent left ACA A1 segment such that both ACA is are supplied from the right. IMPRESSION: 1. Positive for emergent large vessel occlusion: Right MCA M1 segment. Some collateral enhancement visible in the right sylvian fissure. This was discussed by telephone with Dr. Ritta SlotMCNEILL KIRKPATRICK on 06-24-16 At 1820 hours. 2. Occlusion of the left ICA terminus since the MRA on 10/09/2014. Faint collateral enhancement in the left lenticulostriate and left MCA territory. 3. Normal appearing bilateral ACAs, the left is supplied from the right due to diminutive or absent left A1. 4. No atherosclerosis or stenosis in the neck. Negative posterior circulation. Electronically Signed   By: Odessa FlemingH  Hall M.D.   On: 011-17-17 18:47   Ct Head Wo Contrast  06-24-16  CLINICAL DATA:  Right MCA occlusion post cerebral angiogram/intervention. EXAM: CT HEAD WITHOUT CONTRAST TECHNIQUE: Contiguous axial images were obtained from the base of the skull through the vertex without intravenous contrast. COMPARISON:  011-17-17 at 7:54 p.m. as well as 10/08/2014, CTA head with contrast 011-17-17 at 18:07 p.m. FINDINGS: Ventricles and cisterns are within normal. Evidence of patient's known bilateral old ACA territory infarcts. Mild increased density over the right MCA with mild increased density over the right temporal parietal gyri likely due to  contrast injection from patient's recent angiographic procedure. No mass, significant mass effect or shift midline structures. No CT changes of acute infarction. Remainder the exam is unchanged IMPRESSION: Mild increased density over the right MCA and mild hyperdensity over the gyri of the right MCA territory compatible with patient's known MCA occlusion and recent angiographic procedure/intervention. Otherwise, no significant change from recent prior exams. Electronically Signed   By: Elberta Fortisaniel  Boyle M.D.   On: 011-17-17 22:02   Ct Head Wo Contrast  06-24-16  ADDENDUM REPORT: 011-17-17 18:49 ADDENDUM: With regard to impression # 4, the following correction is noted: The chronic appearing bilateral ACA territory infarcts have occurred since March 2016. (not March 2017) Electronically Signed   By: Odessa FlemingH  Hall M.D.   On: 011-17-17 18:49  06-24-16  CLINICAL DATA:  40 year old female code stroke. Left side weakness last seen normal at 1600 hours. Initial encounter. EXAM: CT HEAD WITHOUT CONTRAST TECHNIQUE: Contiguous axial images were obtained from the base of the skull through the vertex without intravenous contrast. COMPARISON:  CTA head and neck from today reported separately. FINDINGS: Hyperdense right MCA M1 (series 201, image 9). No CT changes of acute right MCA territory infarct. See CTA findings today reported separately. ASPECTS score = 10, with regard to the right MCA territory. [place] Stroke Program Early CT Score Normal score = 10 New bilateral ACA territory encephalomalacia since 10/09/2014. Mild posterior insula left MCA territory encephalomalacia corresponding to the March ischemia. Posterior fossa gray-white matter differentiation remains normal. No acute intracranial hemorrhage identified. No midline shift, mass effect, or evidence of intracranial mass lesion. No  ventriculomegaly. No acute osseous abnormality identified. Stable and well pneumatized visualized paranasal sinuses and mastoids.  Rightward gaze deviation. Negative scalp soft tissues. IMPRESSION: 1. Hyperdense right M1 segment, see CTA findings reported separately. 2. No CT changes of right MCA infarct identified; ASPECTS = 10. No acute intracranial hemorrhage. 3. The above was discussed by telephone with Dr. Ritta Slot on 01/17/2016 at 1820 hours. 4. New but chronic appearing bilateral anterior ACA territory infarcts since March. Expected evolution of the left MCA insula infarcts seen at that time. Electronically Signed: By: Odessa Fleming M.D. On: 01/26/2016 18:27   Ct Angio Neck W Or Wo Contrast  01/23/2016  CLINICAL DATA:  40 year old female with mechanical heart valve. Code stroke with left side weakness. Hyperdense right MCA on noncontrast head CT. Left MCA infarcts demonstrated by MRI in 2016. Initial encounter. EXAM: CT ANGIOGRAPHY HEAD AND NECK TECHNIQUE: Multidetector CT imaging of the head and neck was performed using the standard protocol during bolus administration of intravenous contrast. Multiplanar CT image reconstructions and MIPs were obtained to evaluate the vascular anatomy. Carotid stenosis measurements (when applicable) are obtained utilizing NASCET criteria, using the distal internal carotid diameter as the denominator. CONTRAST:  50 mL Isovue-300 COMPARISON:  Head CT without contrast 1754 hours today, 10/09/2014 brain MRI and intracranial MRA. FINDINGS: CT HEAD Reported separately. CTA NECK Skeleton: Partially visible sequelae of median sternotomy. No acute osseous abnormality identified. Other neck: Negative lung apices. No superior mediastinal lymphadenopathy. Thyroid, larynx, pharynx, parapharyngeal spaces, retropharyngeal space, sublingual space, submandibular glands, parotid glands, and cervical lymph nodes are within normal limits. Aortic arch: 3 vessel arch configuration. No arch atherosclerosis or great vessel origin stenosis. Right carotid system: Mild motion artifact in the lower neck. Negative right CCA.  Negative right carotid bifurcation. Negative cervical right ICA. Left carotid system: Mild motion artifact at the level of the lower left CCA which appears to remain normal. Left carotid bifurcation appears widely patent, but distal to the bulb the cervical left ICA is diminutive and remain so to the level of the skullbase. Left ICA caliber is about half that on the right throughout the neck. Vertebral arteries:No proximal subclavian artery stenosis. Normal vertebral artery origins, the right vertebral artery origin is somewhat early. Both vertebral arteries are patent to the skullbase without stenosis. CTA HEAD Posterior circulation: Codominant distal vertebral arteries are normal to the vertebrobasilar junction. Normal right PICA origin. Normal AICA origins. No basilar stenosis. SCA and PCA origins are normal. Diminutive posterior communicating arteries. Bilateral PCA branches are within normal limits. Anterior circulation: Diminutive left ICA siphon remains patent to the anterior genu, but the left ICA terminus appears functionally occluded. There are small left lenticulostriate and left MCA M2 region collaterals, but with little overall left MCA region enhancement. The left ACA A1 segment is chronically diminutive or absent. The right ICA siphon is patent to the right ICA terminus. The right ACA origin is normal. The left ACA is chronically or congenitally supplied from the right side. There is mild motion artifact along the proximal ACA is which appear patent. The distal ACA branches appear within normal limits. Right MCA origin is patent but the right M1 is occluded 11 mm beyond its origin (see series 407, image 45). There is some right M2 collateral enhancement in the sylvian fissure. Anatomic variants: Diminutive or absent left ACA A1 segment such that both ACA is are supplied from the right. IMPRESSION: 1. Positive for emergent large vessel occlusion: Right MCA M1 segment. Some collateral enhancement  visible in the right sylvian fissure. This was discussed by telephone with Dr. Ritta SlotMCNEILL KIRKPATRICK on 01/13/2016 At 1820 hours. 2. Occlusion of the left ICA terminus since the MRA on 10/09/2014. Faint collateral enhancement in the left lenticulostriate and left MCA territory. 3. Normal appearing bilateral ACAs, the left is supplied from the right due to diminutive or absent left A1. 4. No atherosclerosis or stenosis in the neck. Negative posterior circulation. Electronically Signed   By: Odessa FlemingH  Hall M.D.   On: 01/16/2016 18:47   Dg Chest Port 1 View  01/12/2016  CLINICAL DATA:  Encounter for intubation EXAM: PORTABLE CHEST - 1 VIEW COMPARISON:  10/08/2014 FINDINGS: Endotracheal tube tip is below the thoracic inlet, approximately 6 cm above carina. Low lung volumes. Linear scarring or subsegmental atelectasis at the left lung base stable. Previous median sternotomy and valve surgery. Heart size upper limits normal. Surgical clips in the right axilla. IMPRESSION: 1. Chronic left lower lobe scarring or atelectasis. No acute abnormality. 2. Endotracheal tube placement 6 cm above carina. Electronically Signed   By: Corlis Leak  Hassell M.D.   On: 01/19/2016 22:18    Labs:  CBC:  Recent Labs  01/12/2016 1808 01/12/2016 1810 01/29/16 0315  WBC  --  19.9* 13.7*  HGB 14.6 12.7 10.4*  HCT 43.0 41.2 34.3*  PLT  --  290 289    COAGS:  Recent Labs  01/11/2016 1810  INR 1.55*  APTT 29    BMP:  Recent Labs  01/27/2016 1808 01/09/2016 1810 01/29/16 0315  NA 141 139 137  K 3.9 3.9 3.6  CL 106 107 110  CO2  --  23 22  GLUCOSE 123* 124* 113*  BUN 12 10 8   CALCIUM  --  9.7 8.5*  CREATININE 0.80 0.91 0.74  GFRNONAA  --  >60 >60  GFRAA  --  >60 >60    LIVER FUNCTION TESTS:  Recent Labs  01/22/2016 1810  BILITOT 0.5  AST 33  ALT 28  ALKPHOS 81  PROT 7.7  ALBUMIN 4.2    Assessment and Plan: 1. CVA, s/p revascularization of (R) MCA -sheathes being removed currently -neuro status unable to determine  currently -per primary service -will follow  Electronically Signed: Elania Crowl,Arwen E 01/29/2016, 10:37 AM   I spent a total of 15 Minutes at the the patient's bedside AND on the patient's hospital floor or unit, greater than 50% of which was counseling/coordinating care for CVA of (R) MCA

## 2016-01-29 NOTE — Progress Notes (Signed)
Initial Nutrition Assessment  DOCUMENTATION CODES:   Obesity unspecified  INTERVENTION:   If pt remains intubated recommend: Vital High Protein @ 10 ml/hr 60 ml Prostat TID MVI daily Provides: 840 kcal, 111 grams protein, and 200 ml H2O.  TF regimen and propofol at current rate providing 1621 total kcal/day (16 kcal/kg of actual body weight)  NUTRITION DIAGNOSIS:   Inadequate oral intake related to inability to eat as evidenced by NPO status.  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 She received tpa and IR revascularization.   Patient is currently intubated on ventilator support MV: 7.9 L/min Temp (24hrs), Avg:99.5 F (37.5 C), Min:98.2 F (36.8 C), Max:100.3 F (37.9 C)  Propofol: 29.6 ml/hr provides: 781 kcal per day from lipid Medications and Labs reviewed.  Nutrition-Focused physical exam completed. Findings are no fat depletion, no muscle depletion, and mild edema.   Pt discussed during ICU rounds and with RN.  No enteral access Plan for IR today and hopeful extubation 6/24.   Diet Order:  Diet NPO time specified  Skin:  Reviewed, no issues  Last BM:  unknown  Height:   Ht Readings from Last 1 Encounters:  02/05/2016 5\' 3"  (1.6 m)    Weight:   Wt Readings from Last 1 Encounters:  01/18/2016 214 lb 1.1 oz (97.1 kg)    Ideal Body Weight:  52.2 kg  BMI:  Body mass index is 37.93 kg/(m^2).  Estimated Nutritional Needs:   Kcal:  [id number]  Protein:  >/= 104 grams   Fluid:  > 1.5 L/day  EDUCATION NEEDS:   No education needs identified at this time  Kendell BaneHeather Christyana Corwin RD, LDN, CNSC [phone] Pager [phone] After Hours Pager

## 2016-01-29 NOTE — Progress Notes (Signed)
Dr. Roseanne RenoStewart called and orders given to start heparin per pharmacy. CT done prior to starting.                       Miliana Gangwer RN

## 2016-01-29 NOTE — Progress Notes (Signed)
OT Cancellation Note  Patient Details Name: Nichole Black MRN: [id number] DOB: 05/04/1976   Cancelled Treatment:    Reason Eval/Treat Not Completed: Patient not medically ready (active bedrest orders).  Gaye AlkenBailey A Leeba Barbe M.S., OTR/L Pager: [phone]  01/29/2016, 7:21 AM

## 2016-01-29 NOTE — Progress Notes (Signed)
PT Cancellation Note  Patient Details Name: Nichole Black MRN: [id number] DOB: [birth year]   Cancelled Treatment:    Reason Eval/Treat Not Completed: Other (comment).  On active bedrest.   Lurena Joinerebecca B. Mckinleigh Schuchart, PT, DPT [phone]   01/29/2016, 9:23 AM

## 2016-01-29 NOTE — Care Management Note (Signed)
Case Management Note  Patient Details  Name: Nichole Black MRN: [id number] Date of Birth: Jul 04, 1976  Subjective/Objective: Pt admitted on 01/10/2016 s/p Rt ICA infarct with revascularization and TPA given.  PTA, pt resided at home with spouse.                   Action/Plan: Pt currently remains on ventilator.  Will follow for discharge planning as pt progresses.    Expected Discharge Date:                  Expected Discharge Plan:  IP Rehab Facility  In-House Referral:     Discharge planning Services  CM Consult  Post Acute Care Choice:    Choice offered to:     DME Arranged:    DME Agency:     HH Arranged:    HH Agency:     Status of Service:  In process, will continue to follow  If discussed at Long Length of Stay Meetings, dates discussed:    Additional Comments:  Quintella BatonJulie W. Jaleal Schliep, RN, BSN  Trauma/Neuro ICU Case Manager [phone]

## 2016-01-29 NOTE — Progress Notes (Signed)
  Echocardiogram 2D Echocardiogram has been performed.  Janalyn HarderWest, Tadhg Eskew R 01/29/2016, 12:48 PM

## 2016-01-29 NOTE — Progress Notes (Signed)
Neuro IR Lt 4 french femoral sheath removed at 1040.  VPAD and manual pressure applied to achieve hemostasis at 1105.  Distal pulses intact. Site reviewed with Medical laboratory scientific officerJosh RN.  No acute complications.  Pressure dressing applied.  Rt 9 french femoral sheath removed at 1015.  VPAD and manual pressure applied to achieve hemostasis at 1215.  Distal pulses intact.  Site reviewed with Medical laboratory scientific officerJosh RN.  No acute complications.  Pressure dressing applied.  [facility] Association[facility]ather Cindy Brindisi Juliet King-Cushman

## 2016-01-29 NOTE — Progress Notes (Signed)
Small amount of possible SAH on MRI. I suspect that there e is no active bleeding, and with mechnical valve risk is considerably higher. Will get CT, and if no prominent hemorrhage on CT will start low dose heparin drip.   Ritta SlotMcNeill Phila Shoaf, MD [facility] [phone]  If 7pm- 7am, please page neurology on call as listed in AMION.

## 2016-01-30 ENCOUNTER — Inpatient Hospital Stay (HOSPITAL_COMMUNITY): Payer: 59

## 2016-01-30 LAB — BASIC METABOLIC PANEL
Anion gap: 6 (ref 5–15)
BUN: 6 mg/dL (ref 6–20)
CALCIUM: 8.4 mg/dL — AB (ref 8.9–10.3)
CO2: 23 mmol/L (ref 22–32)
Chloride: 108 mmol/L (ref 101–111)
Creatinine, Ser: 0.7 mg/dL (ref 0.44–1.00)
GFR calc Af Amer: >60 mL/min (ref >60)
GLUCOSE: 96 mg/dL (ref 65–99)
Potassium: 3.3 mmol/L — ABNORMAL LOW (ref 3.5–5.1)
Sodium: 137 mmol/L (ref 135–145)

## 2016-01-30 LAB — CBC
HEMATOCRIT: 32.3 % — AB (ref 36.0–46.0)
HEMOGLOBIN: 9.5 g/dL — AB (ref 12.0–15.0)
MCH: 23.8 pg — AB (ref 26.0–34.0)
MCHC: 29.4 g/dL — AB (ref 30.0–36.0)
MCV: 80.8 fL (ref 78.0–100.0)
Platelets: 256 10*3/uL (ref 150–400)
RBC: 4 MIL/uL (ref 3.87–5.11)
RDW: 16.9 % — ABNORMAL HIGH (ref 11.5–15.5)
WBC: 12.8 10*3/uL — ABNORMAL HIGH (ref 4.0–10.5)

## 2016-01-30 LAB — URINALYSIS, ROUTINE W REFLEX MICROSCOPIC
Bilirubin Urine: NEGATIVE
Glucose, UA: NEGATIVE mg/dL
HGB URINE DIPSTICK: NEGATIVE
Ketones, ur: >80 mg/dL — AB
LEUKOCYTES UA: NEGATIVE
Nitrite: NEGATIVE
Protein, ur: NEGATIVE mg/dL
SPECIFIC GRAVITY, URINE: 1.027 (ref 1.005–1.030)
pH: 5.5 (ref 5.0–8.0)

## 2016-01-30 LAB — HEPARIN LEVEL (UNFRACTIONATED)
Heparin Unfractionated: <0.1 IU/mL — ABNORMAL LOW (ref 0.30–0.70)
Heparin Unfractionated: 0.15 IU/mL — ABNORMAL LOW (ref 0.30–0.70)
Heparin Unfractionated: 0.95 IU/mL — ABNORMAL HIGH (ref 0.30–0.70)

## 2016-01-30 LAB — TSH: TSH: 0.581 u[IU]/mL (ref 0.350–4.500)

## 2016-01-30 LAB — HEMOGLOBIN A1C
Hgb A1c MFr Bld: 5.1 % (ref 4.8–5.6)
MEAN PLASMA GLUCOSE: 100 mg/dL

## 2016-01-30 LAB — TRIGLYCERIDES: Triglycerides: 359 mg/dL — ABNORMAL HIGH (ref <150)

## 2016-01-30 LAB — VITAMIN B12: Vitamin B-12: 406 pg/mL (ref 180–914)

## 2016-01-30 MED ORDER — CHLORHEXIDINE GLUCONATE 0.12 % MT SOLN
15.0000 mL | Freq: Two times a day (BID) | OROMUCOSAL | Status: DC
  Administered 2016-01-31 – 2016-02-02 (×5): 15 mL via OROMUCOSAL
  Filled 2016-01-30 (×5): qty 15

## 2016-01-30 MED ORDER — CETYLPYRIDINIUM CHLORIDE 0.05 % MT LIQD
7.0000 mL | Freq: Two times a day (BID) | OROMUCOSAL | Status: DC
Start: 2016-01-31 — End: 2016-02-03
  Administered 2016-01-31 – 2016-02-02 (×4): 7 mL via OROMUCOSAL

## 2016-01-30 MED ORDER — POTASSIUM CHLORIDE 10 MEQ/100ML IV SOLN
10.0000 meq | INTRAVENOUS | Status: AC
  Administered 2016-01-30 (×4): 10 meq via INTRAVENOUS
  Filled 2016-01-30 (×4): qty 100

## 2016-01-30 MED ORDER — HEPARIN (PORCINE) IN NACL 100-0.45 UNIT/ML-% IJ SOLN
1400.0000 [IU]/h | INTRAMUSCULAR | Status: DC
  Administered 2016-01-31: 950 [IU]/h via INTRAVENOUS
  Administered 2016-02-01: 1400 [IU]/h via INTRAVENOUS
  Administered 2016-02-01: 1300 [IU]/h via INTRAVENOUS
  Administered 2016-02-02: 1400 [IU]/h via INTRAVENOUS
  Filled 2016-01-30 (×4): qty 250

## 2016-01-30 NOTE — Progress Notes (Signed)
Patient failed RN swallow screen on 6/22. No screening done today. Discussed with SLP. SLP to see patient on 6/25

## 2016-01-30 NOTE — Progress Notes (Signed)
eLink Physician-Brief Progress Note Patient Name: Nichole Black DOB: 1975-08-31 MRN: [id number]   Date of Service  01/30/2016  HPI/Events of Note  K 3.3. Creatinine 0.7. Has PIV access.  eICU Interventions  KCl 10mEq IV x 4 runs     Intervention Category Intermediate Interventions: Electrolyte abnormality - evaluation and management  [name] 01/30/2016, 6:31 AM

## 2016-01-30 NOTE — Progress Notes (Signed)
PT Cancellation Note  Patient Details Name: Nichole AcresKelly Mcneece MRN: [id number] DOB: [birth year]   Cancelled Treatment:    Reason Eval/Treat Not Completed: Medical issues which prohibited therapy; Spoke with RN this morning.  Reports plan to try to extubate later today.  Will check back later to see if ready for PT.    Elray McgregorCynthia Wynn 01/30/2016, 9:56 AM  Sheran Lawlessyndi Wynn, PT [phone] 01/30/2016

## 2016-01-30 NOTE — Progress Notes (Signed)
ANTICOAGULATION CONSULT NOTE - Follow Up Consult  Pharmacy Consult for Heparin  Indication: stroke, mechanical AVR  Allergies  Allergen Reactions  . Warfarin Sodium Itching and Other (See Comments)    Cannot take Generic, only brand name  . Hydrocodone Itching    Patient Measurements: Height: 5\' 3"  (160 cm) Weight: 214 lb 1.1 oz (97.1 kg) IBW/kg (Calculated) : 52.4  Vital Signs: Temp: 101.4 F (38.6 C) (06/24 0400) Temp Source: Axillary (06/24 0400) BP: 124/65 mmHg (06/24 0400) Pulse Rate: 79 (06/24 0400)  Labs:  Recent Labs  02-16-16 1808 02-16-16 1810 01/29/16 0315 01/30/16 0532  HGB 14.6 12.7 10.4* 9.5*  HCT 43.0 41.2 34.3* 32.3*  PLT  --  290 289 256  APTT  --  29  --   --   LABPROT  --  18.6*  --   --   INR  --  1.55*  --   --   HEPARINUNFRC  --   --   --  <0.10*  CREATININE 0.80 0.91 0.74  --   TROPONINI  --  0.05*  --   --     Estimated Creatinine Clearance: 104.8 mL/min (by C-G formula based on Cr of 0.74).   Assessment: 40 y/o F s/p tPA/IR for stroke on 6/22, on heparin drip given high risk with mechanical valve, neurology aware of MRI/CT results with small SAH, HL <0.1 this AM, no issues per RN.   Goal of Therapy:  Heparin level 0.3-0.5 units/ml Monitor platelets by anticoagulation protocol: Yes   Plan:  -Increase heparin to 950 units/hr -1300 HL  Abran DukeLedford, Olga Bourbeau 01/30/2016,6:25 AM

## 2016-01-30 NOTE — Procedures (Signed)
Extubation Procedure Note  Patient Details:   Name: Nichole Black DOB: 1975/12/02 MRN: [id number]   Airway Documentation:     Evaluation  O2 sats: stable throughout Complications: No apparent complications Patient did tolerate procedure well. Bilateral Breath Sounds: Clear, Diminished   Yes  Patient tolerated wean. MD ordered to extubate. Positive for cuff leak. Patient extubated to a 3 Lpm nasal cannula. No signs of dyspnea or stridor. Patient instructed on the Incentive Spirometer achieving 400 mL three times. RN at bedside.    Ancil BoozerSmallwood, Tykeshia Tourangeau 01/30/2016, 11:57 AM

## 2016-01-30 NOTE — Progress Notes (Signed)
PULMONARY / CRITICAL CARE MEDICINE   Name: Nichole AcresKelly Denn MRN: [id number] DOB: [birth year]    ADMISSION DATE:  06-09-16 CONSULTATION DATE:  6/22  REFERRING MD:  Amada JupiterKirkpatrick  CHIEF COMPLAINT:  L sided weakness  HISTORY OF PRESENT ILLNESS:  Patient is encephalopathic and/or intubated. Therefore history has been obtained from chart review.  40 year old female with PMH as below, which is significant for aortic valve replacement and CVA. She had repotedly been recovering wel from prior stroke. 6/22 she was having an argument at home with son when she developed acute onset L sided weakness and suffered a fall. She was last known well until around 3:30-4pm at which point she went to sleep. Her husband went to wake her up around 430 and found her on floor. He called 911 and she was transported to ED where she was given tpa for suspected CVA. Ct angiogram showed MCA infarct which is thought to be embolic in nature. She was then taken to IR for intervention and recovered in ICU on ventilator. PCCM to assist with medical management.    SUBJECTIVE:  A bit sleepy, sedation being weaned to off.  Heparin restarted last pm   VITAL SIGNS: BP 121/69 mmHg  Pulse 75  Temp(Src) 100 F (37.8 C) (Oral)  Resp 10  Ht 5\' 3"  (1.6 m)  Wt 97.1 kg (214 lb 1.1 oz)  BMI 37.93 kg/m2  SpO2 100%  LMP 12/28/2015  HEMODYNAMICS:    VENTILATOR SETTINGS: Vent Mode:  [-] PSV;CPAP FiO2 (%):  [30 %-40 %] 30 % Set Rate:  [18 bmp] 18 bmp Vt Set:  [420 mL] 420 mL PEEP:  [5 cmH20] 5 cmH20 Pressure Support:  [5 cmH20] 5 cmH20 Plateau Pressure:  [10 cmH20-19 cmH20] 13 cmH20  INTAKE / OUTPUT: I/O last 3 completed shifts: In: [id number] [I.V.:4386; IV Piggyback:100] Out: 2910 [Urine:2810; Blood:100]  PHYSICAL EXAMINATION:  General:  Obese female in NAD on vent Neuro:  Sedated HEENT:   [place]/AT, PERRL, no JVD Cardiovascular:  RRR, 2/6 SEM and click Lungs:  Clear bilateral breath sounds Abdomen:  Soft,  non-distended Musculoskeletal:  No acute deformity Skin:  Grossly intact  LABS:  BMET  Recent Labs Lab February 11, 2016 1810 01/29/16 0315 01/30/16 0531  NA 139 137 137  K 3.9 3.6 3.3*  CL 107 110 108  CO2 23 22 23   BUN 10 8 6   CREATININE 0.91 0.74 0.70  GLUCOSE 124* 113* 96    Electrolytes  Recent Labs Lab February 11, 2016 1810 01/29/16 0315 01/30/16 0531  CALCIUM 9.7 8.5* 8.4*    CBC  Recent Labs Lab February 11, 2016 1810 01/29/16 0315 01/30/16 0532  WBC 19.9* 13.7* 12.8*  HGB 12.7 10.4* 9.5*  HCT 41.2 34.3* 32.3*  PLT 290 289 256    Coag's  Recent Labs Lab February 11, 2016 1810  APTT 29  INR 1.55*    Sepsis Markers No results for input(s): LATICACIDVEN, PROCALCITON, O2SATVEN in the last 168 hours.  ABG  Recent Labs Lab February 11, 2016 2200  PHART 7.375  PCO2ART 39.1  PO2ART 429*    Liver Enzymes  Recent Labs Lab February 11, 2016 1810  AST 33  ALT 28  ALKPHOS 81  BILITOT 0.5  ALBUMIN 4.2    Cardiac Enzymes  Recent Labs Lab February 11, 2016 1810  TROPONINI 0.05*    Glucose  Recent Labs Lab February 11, 2016 1828  GLUCAP 106*    Imaging Ct Head Wo Contrast  01/29/2016  CLINICAL DATA:  40 year old female recent CVA treated with tPA. EXAM: CT HEAD WITHOUT CONTRAST TECHNIQUE: Contiguous  axial images were obtained from the base of the skull through the vertex without intravenous contrast. COMPARISON:  Head CT dated 2016/01/29 and MRI dated 01/29/2016 FINDINGS: High attenuating material primarily within the right sylvian fissure appears grossly stable and likely represents a small subarachnoid hemorrhage. No new hemorrhage identified. An area of hypodensity in the right lentiform nucleus represent edema and corresponds to the infarcts seen on the MRI. There is resulting mild mass effect and mild compression of the right lateral ventricle. No midline shift. Stable appearing areas hypodensity in the frontal lobes along the anterior falx correspond to the old ACA territory infarct. Faint  linear high attenuation over posterior parietal convexities may represent volume averaging subarachnoid hemorrhage. The visualized paranasal sinuses mastoid air cells are clear. The calvarium is intact. IMPRESSION: Right basal ganglia infarct with edema and mild mass effect on the right lateral ventricle. No midline shift. Small stable appearing high attenuation in the right sylvian fissure, likely small subarachnoid hemorrhage, grossly stable compared to prior study. Follow-up recommended. No new hemorrhage identified. Electronically Signed   By: Elgie Collard M.D.   On: 01/29/2016 21:59   Mr Maxine Glenn Head Wo Contrast  01/29/2016  CLINICAL DATA:  Stroke. Sudden onset left-sided weakness with fall. Right M1 occlusion on CTA yesterday followed by endovascular treatment with complete revascularization. EXAM: MRI HEAD WITHOUT CONTRAST MRA HEAD WITHOUT CONTRAST TECHNIQUE: Multiplanar, multiecho pulse sequences of the brain and surrounding structures were obtained without intravenous contrast. Angiographic images of the head were obtained using MRA technique without contrast. COMPARISON:  Head CT and CTA Jan 29, 2016.  Brain MRI 10/09/2014. FINDINGS: MRI HEAD FINDINGS There is an acute right MCA/lateral lenticulostriate infarct involving the basal ganglia including essentially the entirety of the putamen, caudate body, and adjacent white matter tracts including corona radiata. In total this measures approximately 4 x 4 x 2.5 cm without evidence of associated hemorrhage. There are a few punctate foci of restricted diffusion scattered throughout the left cerebral hemisphere which may reflect tiny acute infarcts or be related to yesterday's procedure. There is susceptibility artifact and abnormal FLAIR signal within the right sylvian fissure consistent with small volume subarachnoid hemorrhage. Abnormal FLAIR signal is also present in multiple cerebral sulci, right greater than left, without associated susceptibility  artifact. This may reflect additional subarachnoid blood products as well as possible artifact from patient's ventilated/ hyperoxygenated state. Subarachnoid blood products and/or contrast material was demonstrated in the right sylvian fissure and cerebral sulci on yesterday's postprocedure CT. There is mild mass effect on the right lateral ventricle from a edema from the acute infarct. There is no midline shift, mass, or extra-axial fluid collection. There are chronic bilateral frontal lobe ACA territory infarcts which are new from the prior MRI. Mild encephalomalacia is noted in the left corona radiata and left very insular region related to an old MCA infarct. Scattered foci of chronic microhemorrhage are again seen peripherally throughout both cerebral hemispheres and are stable to at most minimally increased in number from the prior MRI. A chronic microhemorrhage in the left cerebellum is unchanged. Orbits are unremarkable. Minimal paranasal sinus and right mastoid air cell edema. Abnormal appearance of the left MCA and distal ICA, more fully evaluated below. MRA HEAD FINDINGS The visualized distal vertebral arteries are widely patent to the basilar. Patent right PICA, bilateral AICA, and bilateral SCA origins. Widely patent basilar artery. Patent left posterior communicating artery. PCAs are patent without evidence of significant proximal stenosis or major branch occlusion. Asymmetric attenuation of distal right PCA  branch vessels is slightly more prominent than on the prior MRA. The intracranial right ICA is patent without stenosis. The right MCA is now patent without evidence of major branch occlusion or significant proximal stenosis. The right ACA is patent without evidence of significant stenosis and supplies the left A2 segment. The left A1 segment may be congenitally absent. The the visualized distal cervical and intracranial segments of the left ICA are diminutive as seen on yesterday's CTA with  occlusion at the level of the terminus. No significant flow related enhancement is present in the left A1 or left M1 segments. Small collateral vessels are noted in the left lateral lenticulostriate region with minimal flow related enhancement in some distal left MCA branches. No intracranial aneurysm is identified. IMPRESSION: 1. Acute nonhemorrhagic right MCA infarct involving the basal ganglia. 2. Small volume subarachnoid hemorrhage in the right sylvian fissure. Additional subarachnoid blood products versus artifactual FLAIR signal in bilateral cerebral sulci. 3. 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 4. Stable to minimally increased number of scattered chronic cerebral microhemorrhages. 5. Interval right MCA revascularization. 6. 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 Electronically Signed   By: Sebastian AcheAllen  Grady M.D.   On: 01/29/2016 18:16   Mr Brain Wo Contrast  01/29/2016  CLINICAL DATA:  Stroke. Sudden onset left-sided weakness with fall. Right M1 occlusion on CTA yesterday followed by endovascular treatment with complete revascularization. EXAM: MRI HEAD WITHOUT CONTRAST MRA HEAD WITHOUT CONTRAST TECHNIQUE: Multiplanar, multiecho pulse sequences of the brain and surrounding structures were obtained without intravenous contrast. Angiographic images of the head were obtained using MRA technique without contrast. COMPARISON:  Head CT and CTA 01/08/2016.  Brain MRI 10/09/2014. FINDINGS: MRI HEAD FINDINGS There is an acute right MCA/lateral lenticulostriate infarct involving the basal ganglia including essentially the entirety of the putamen, caudate body, and adjacent white matter tracts including corona radiata. In total this measures approximately 4 x 4 x 2.5 cm without evidence of associated hemorrhage. There are a few punctate foci of restricted diffusion scattered throughout the left cerebral hemisphere which may reflect tiny acute infarcts or be related to  yesterday's procedure. There is susceptibility artifact and abnormal FLAIR signal within the right sylvian fissure consistent with small volume subarachnoid hemorrhage. Abnormal FLAIR signal is also present in multiple cerebral sulci, right greater than left, without associated susceptibility artifact. This may reflect additional subarachnoid blood products as well as possible artifact from patient's ventilated/ hyperoxygenated state. Subarachnoid blood products and/or contrast material was demonstrated in the right sylvian fissure and cerebral sulci on yesterday's postprocedure CT. There is mild mass effect on the right lateral ventricle from a edema from the acute infarct. There is no midline shift, mass, or extra-axial fluid collection. There are chronic bilateral frontal lobe ACA territory infarcts which are new from the prior MRI. Mild encephalomalacia is noted in the left corona radiata and left very insular region related to an old MCA infarct. Scattered foci of chronic microhemorrhage are again seen peripherally throughout both cerebral hemispheres and are stable to at most minimally increased in number from the prior MRI. A chronic microhemorrhage in the left cerebellum is unchanged. Orbits are unremarkable. Minimal paranasal sinus and right mastoid air cell edema. Abnormal appearance of the left MCA and distal ICA, more fully evaluated below. MRA HEAD FINDINGS The visualized distal vertebral arteries are widely patent to the basilar. Patent right PICA, bilateral AICA, and bilateral SCA origins. Widely patent basilar artery. Patent left posterior communicating artery. PCAs  are patent without evidence of significant proximal stenosis or major branch occlusion. Asymmetric attenuation of distal right PCA branch vessels is slightly more prominent than on the prior MRA. The intracranial right ICA is patent without stenosis. The right MCA is now patent without evidence of major branch occlusion or significant  proximal stenosis. The right ACA is patent without evidence of significant stenosis and supplies the left A2 segment. The left A1 segment may be congenitally absent. The the visualized distal cervical and intracranial segments of the left ICA are diminutive as seen on yesterday's CTA with occlusion at the level of the terminus. No significant flow related enhancement is present in the left A1 or left M1 segments. Small collateral vessels are noted in the left lateral lenticulostriate region with minimal flow related enhancement in some distal left MCA branches. No intracranial aneurysm is identified. IMPRESSION: 1. Acute nonhemorrhagic right MCA infarct involving the basal ganglia. 2. Small volume subarachnoid hemorrhage in the right sylvian fissure. Additional subarachnoid blood products versus artifactual FLAIR signal in bilateral cerebral sulci. 3. 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 4. Stable to minimally increased number of scattered chronic cerebral microhemorrhages. 5. Interval right MCA revascularization. 6. 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 Electronically Signed   By: Sebastian Ache M.D.   On: 01/29/2016 18:16     STUDIES:  CT angiogram head 6/22 > Right MCA M1 segment emergent occlusion, occlusion of the left ICA terminus.  MRI 6/23 >> non-hemorrhagic R MCA CVA, small volume R sylvian fissure SAH, stable to minimally increased scattered micro-hemorrhages, L ICA occlusion HEad CT 6/23 >> no change in appearance compared with MRI brain, no new bleeding or increased SAH  CULTURES: MRSA (-) 6/22  ANTIBIOTICS:   SIGNIFICANT EVENTS: 6/22 admit with CVA. Got tpa and to neuro IR. Then to ICU intubated.  LINES/TUBES: ETT 6/22  DISCUSSION: 40 year old female with PMH of AVR and prior stroke admitted 6/22 for R MCA CVA. She received tpa and IR revascularization. She is in ICU for recovery on vent. Requiring nicardipine for BP control.  ASSESSMENT  / PLAN:  PULMONARY A: Acute Hypoxemic respratory failure 2/2 inability to protect airway/CVA  P:   Full vent support Vent Bundle PSV and goal extubate 6/24  CARDIOVASCULAR A:  H/o Aortic valve replacement (bioprosthetic valve)  P:  Telemetry monitoring SBP goal 120-140 per IR Nicardipine infusion off Heparin started 6/23 pm after repeat head CT stable, tolerating  RENAL A:   Hypokalemia  P:   Follow BMP On IVF  GASTROINTESTINAL A:   No acute issues  P:   Pepcid for SUP NPO Swallow eval once extubated  HEMATOLOGIC A:   On chronic warfarin  P:  Follow CBC Heparin gtt initiated 6/23 Holding warfarin, will discuss w Neuro appropriate time to consider restart  INFECTIOUS A:   Leukocytosis  P:   Monitor off ABX Monitor WBC and fever curve  ENDOCRINE A:   Hyperglycemia with no history DM   P:   Monitor glucose on chemistry  NEUROLOGIC A:   Acute R MCA infarct s/p tpa and IR revascularization   P:   RASS goal: -1 to 0 Propofol infusion > plan wean to off 6/24 Fentanyl gtt; goal d/c if she is extubated    FAMILY  - Updates: no family on 6/24  - Inter-disciplinary family meet or Palliative Care meeting due by:  6/29  Independent CC time 37 minutes   Levy Pupa, MD, PhD  01/30/2016, 9:41 AM [facility] [phone] or if no answer [phone]

## 2016-01-30 NOTE — Progress Notes (Signed)
ANTICOAGULATION CONSULT NOTE - Follow Up Consult  Pharmacy Consult for Heparin  Indication: stroke, mechanical AVR  Allergies  Allergen Reactions  . Warfarin Sodium Itching and Other (See Comments)    Cannot take Generic, only brand name  . Hydrocodone Itching    Patient Measurements: Height: 5\' 3"  (160 cm) Weight: 214 lb 1.1 oz (97.1 kg) IBW/kg (Calculated) : 52.4  Vital Signs: Temp: 98.7 F (37.1 C) (06/24 1516) Temp Source: Oral (06/24 1516) BP: 122/66 mmHg (06/24 2000) Pulse Rate: 80 (06/24 2000)  Labs:  Recent Labs  06-26-2016 1810 01/29/16 0315 01/30/16 0531 01/30/16 0532 01/30/16 1310 01/30/16 1954  HGB 12.7 10.4*  --  9.5*  --   --   HCT 41.2 34.3*  --  32.3*  --   --   PLT 290 289  --  256  --   --   APTT 29  --   --   --   --   --   LABPROT 18.6*  --   --   --   --   --   INR 1.55*  --   --   --   --   --   HEPARINUNFRC  --   --   --  <0.10* 0.15* 0.95*  CREATININE 0.91 0.74 0.70  --   --   --   TROPONINI 0.05*  --   --   --   --   --     Estimated Creatinine Clearance: 104.8 mL/min (by C-G formula based on Cr of 0.7).   Assessment: 40 y/o F s/p tPA/IR for stroke on 6/22, on heparin drip given high risk with mechanical valve, neurology aware of MRI/CT results with small SAH.  HL now above goal (0.9) after rate increase earlier, no issues with IV/bleeding or where level was drawn. Will hold x 30min and restart at 950/hr. Orders given to nurse.  Goal of Therapy:  Heparin level 0.3-0.5 units/ml Monitor platelets by anticoagulation protocol: Yes   Plan:  -hold heparin for 30 min then restart at 950 units/hr -HL with am labs   Sheppard CoilFrank Wilson PharmD., BCPS Clinical Pharmacist Pager [phone] 01/30/2016 8:28 PM

## 2016-01-30 NOTE — Evaluation (Signed)
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  PMH positive for mechanical heart valve and previous stroke on coumadin.   Clinical Impression  Patient presents with decreased independence with mobility due to L side weakness, decreased awareness in addition to issues listed in PT problem list.  She will benefit from skilled PT in the acute setting to allow return home with family support following CIR level rehab.     Follow Up Recommendations CIR    Equipment Recommendations  Other (comment) (TBA)    Recommendations for Other Services Rehab consult;OT consult     Precautions / Restrictions Precautions Precautions: Fall Precaution Comments: dysphagia/dysarthria      Mobility  Bed Mobility Overal bed mobility: Needs Assistance Bed Mobility: Supine to Sit;Sit to Sidelying     Supine to sit: Mod assist   Sit to sidelying: Mod assist General bed mobility comments: assist for left leg off bed and to lift trunk some for initiation and some due to weakness; sit to side cues and assist for legs into bed  Transfers Overall transfer level: Needs assistance   Transfers: Sit to/from Stand Sit to Stand: Mod assist         General transfer comment: up from EOB with cues and lifting help; did side step to [facility] mod support difficulty wtih L side awareness to [facility]  Ambulation/Gait                Stairs            Wheelchair Mobility    Modified Rankin (Stroke Patients Only) Modified Rankin (Stroke Patients Only) Pre-Morbid Rankin Score: No significant disability Modified Rankin: Moderately severe disability     Balance Overall balance assessment: Needs assistance Sitting-balance support: Feet supported Sitting balance-Leahy Scale: Fair Sitting balance - Comments: sat EOB with cues for alertness and keeping weight shifted forward and to R; but when alert can maintan balance unsupported without cues   Standing balance support: Single extremity supported Standing balance-Leahy Scale: Poor Standing balance comment: used R to hold onto my arm standing briefly [facility]                             Pertinent Vitals/Pain Pain Assessment: No/denies pain    Home Living Family/patient expects to be discharged to:: Private residence Living Arrangements: Spouse/significant other;Children Available Help at Discharge: Family;Available 24 hours/day Type of Home: House Home Access: Stairs to enter Entrance Stairs-Rails: Right Entrance Stairs-Number of Steps: 8 Home Layout: Two level;Able to live on main level with bedroom/bathroom        Prior Function Level of Independence: Independent               Hand Dominance   Dominant Hand: Right    Extremity/Trunk Assessment   Upper Extremity Assessment: LUE deficits/detail       LUE Deficits / Details: AAROM WFL, strength shoulder flexion 2+/5, elbow flexion 3+/5   Lower Extremity Assessment: LLE deficits/detail   LLE Deficits / Details: AAROM WFL, strength hip flexion 3-/5, knee extension 4-/5, ankle DF 3-/5; reports intact sensation throughout  Cervical / Trunk Assessment:  Other exceptions  Communication   Communication: Expressive difficulties  Cognition Arousal/Alertness: Lethargic Behavior During Therapy: WFL for tasks assessed/performed Overall Cognitive Status: Difficult to assess                      General Comments      Exercises        Assessment/Plan    PT Assessment Patient needs continued PT services  PT Diagnosis Hemiplegia non-dominant side;Generalized weakness;Abnormality  of gait   PT Problem List Decreased strength;Decreased activity tolerance;Decreased mobility;Decreased balance;Decreased coordination;Decreased cognition;Decreased safety awareness;Decreased knowledge of use of DME  PT Treatment Interventions DME instruction;Gait training;Functional mobility training;Patient/family education;Therapeutic activities;Therapeutic exercise;Neuromuscular re-education   PT Goals (Current goals can be found in the Care Plan section) Acute Rehab PT Goals Patient Stated Goal: To go home PT Goal Formulation: With patient Time For Goal Achievement: 02/13/16 Potential to Achieve Goals: Good    Frequency Min 4X/week   Barriers to discharge        Co-evaluation               End of Session Equipment Utilized During Treatment: Gait belt Activity Tolerance: Patient limited by fatigue Patient left: in bed;with call bell/phone within reach;with nursing/sitter in room           Time: [id number] PT Time Calculation (min) (ACUTE ONLY): 29 min   Charges:   PT Evaluation $PT Eval Moderate Complexity: 1 Procedure PT Treatments $Therapeutic Activity: 8-22 mins   PT G CodesElray Mcgregor:        Hamish Banks 01/30/2016, 4:06 PM  Sheran Lawlessyndi Nakaiya Beddow, PT [phone] 01/30/2016

## 2016-01-30 NOTE — Progress Notes (Signed)
Referring Physician(s): Dr Marvel Plan  Supervising Physician: Julieanne Cotton  Patient Status:  Inpatient  Chief Complaint:  CVA Procedures    CEREBRAL ANGIOGRAM [[id number] (Custom)]      Expand All Collapse All   S/P RT common carotid arteriogram,follwed by complete revascularization of occluded RT MCA With x 2 passes with Solitaire Fr 4mm x 40 mm retrieval device and 3mg  of superselective intracranial IA TPA with TICI 3 flow restoration.       Subjective:  Rt MCA clot retrieval with revascularization 6/22 Resting Sl sedated Following commands  Allergies: Warfarin sodium and Hydrocodone  Medications: Prior to Admission medications   Medication Sig Start Date End Date Taking? Authorizing Provider  amitriptyline (ELAVIL) 25 MG tablet Take 1 tablet (25 mg total) by mouth at bedtime. Reported on 08/27/2015 08/27/15 08/26/16 Yes Nilda Riggs, NP  topiramate (TOPAMAX) 25 MG tablet Take 1 tablet (25 mg total) by mouth 2 (two) times daily. Patient taking differently: Take 25 mg by mouth 2 (two) times daily as needed.  02/11/15  Yes Micki Riley, MD  warfarin (COUMADIN) 4 MG tablet daily 01/08/15  Yes Historical Provider, MD  pravastatin (PRAVACHOL) 20 MG tablet Take 1 tablet (20 mg total) by mouth daily at 6 PM. Patient not taking: Reported on 08/27/2015 10/13/14   Micki Riley, MD     Vital Signs: BP 123/72 mmHg  Pulse 87  Temp(Src) 99.1 F (37.3 C) (Oral)  Resp 12  Ht 5\' 3"  (1.6 m)  Wt 214 lb 1.1 oz (97.1 kg)  BMI 37.93 kg/m2  SpO2 100%  LMP 12/28/2015  Physical Exam  Cardiovascular: Normal rate.   Pulmonary/Chest: Effort normal.  Musculoskeletal: Normal range of motion.  Moves all 4s Left is slower but definite Groins are clean and dry NT no hematoma B feet with 2+ pulses  Nursing note and vitals reviewed.   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 Code stroke with  left side weakness. Hyperdense right MCA on noncontrast head CT. Left MCA infarcts demonstrated by MRI in 2016. Initial encounter. EXAM: CT ANGIOGRAPHY HEAD AND NECK TECHNIQUE: Multidetector CT imaging of the head and neck was performed using the standard protocol during bolus administration of intravenous contrast. Multiplanar CT image reconstructions and MIPs were obtained to evaluate the vascular anatomy. Carotid stenosis measurements (when applicable) are obtained utilizing NASCET criteria, using the distal internal carotid diameter as the denominator. CONTRAST:  50 mL Isovue-300 COMPARISON:  Head CT without contrast 1754 hours today, 10/09/2014 brain MRI and intracranial MRA. FINDINGS: CT HEAD Reported separately. CTA NECK Skeleton: Partially visible sequelae of median sternotomy. No acute osseous abnormality identified. Other neck: Negative lung apices. No superior mediastinal lymphadenopathy. Thyroid, larynx, pharynx, parapharyngeal spaces, retropharyngeal space, sublingual space, submandibular glands, parotid glands, and cervical lymph nodes are within normal limits. Aortic arch: 3 vessel arch configuration. No arch atherosclerosis or great vessel origin stenosis. Right carotid system: Mild motion artifact in the lower neck. Negative right CCA. Negative right carotid bifurcation. Negative cervical right ICA. Left carotid system: Mild motion artifact at the level of the lower left CCA which appears to remain normal. Left carotid bifurcation appears widely patent, but distal to the bulb the cervical left ICA is diminutive and remain so to the level of the skullbase. Left ICA caliber is about half that on the right throughout the neck. Vertebral arteries:No proximal subclavian artery stenosis. Normal vertebral artery origins,  the right vertebral artery origin is somewhat early. Both vertebral arteries are patent to the skullbase without stenosis. CTA HEAD Posterior circulation: Codominant distal vertebral  arteries are normal to the vertebrobasilar junction. Normal right PICA origin. Normal AICA origins. No basilar stenosis. SCA and PCA origins are normal. Diminutive posterior communicating arteries. Bilateral PCA branches are within normal limits. Anterior circulation: Diminutive left ICA siphon remains patent to the anterior genu, but the left ICA terminus appears functionally occluded. There are small left lenticulostriate and left MCA M2 region collaterals, but with little overall left MCA region enhancement. The left ACA A1 segment is chronically diminutive or absent. The right ICA siphon is patent to the right ICA terminus. The right ACA origin is normal. The left ACA is chronically or congenitally supplied from the right side. There is mild motion artifact along the proximal ACA is which appear patent. The distal ACA branches appear within normal limits. Right MCA origin is patent but the right M1 is occluded 11 mm beyond its origin (see series 407, image 45). There is some right M2 collateral enhancement in the sylvian fissure. Anatomic variants: Diminutive or absent left ACA A1 segment such that both ACA is are supplied from the right. IMPRESSION: 1. Positive for emergent large vessel occlusion: Right MCA M1 segment. Some collateral enhancement visible in the right sylvian fissure. This was discussed by telephone with Dr. Ritta SlotMCNEILL KIRKPATRICK on 2015/11/14 At 1820 hours. 2. Occlusion of the left ICA terminus since the MRA on 10/09/2014. Faint collateral enhancement in the left lenticulostriate and left MCA territory. 3. Normal appearing bilateral ACAs, the left is supplied from the right due to diminutive or absent left A1. 4. No atherosclerosis or stenosis in the neck. Negative posterior circulation. Electronically Signed   By: Odessa FlemingH  Hall M.D.   On: 02017/04/08 18:47   Ct Head Wo Contrast  01/29/2016  CLINICAL DATA:  40 year old female recent CVA treated with tPA. EXAM: CT HEAD WITHOUT CONTRAST TECHNIQUE:  Contiguous axial images were obtained from the base of the skull through the vertex without intravenous contrast. COMPARISON:  Head CT dated 02017/04/08 and MRI dated 01/29/2016 FINDINGS: High attenuating material primarily within the right sylvian fissure appears grossly stable and likely represents a small subarachnoid hemorrhage. No new hemorrhage identified. An area of hypodensity in the right lentiform nucleus represent edema and corresponds to the infarcts seen on the MRI. There is resulting mild mass effect and mild compression of the right lateral ventricle. No midline shift. Stable appearing areas hypodensity in the frontal lobes along the anterior falx correspond to the old ACA territory infarct. Faint linear high attenuation over posterior parietal convexities may represent volume averaging subarachnoid hemorrhage. The visualized paranasal sinuses mastoid air cells are clear. The calvarium is intact. IMPRESSION: Right basal ganglia infarct with edema and mild mass effect on the right lateral ventricle. No midline shift. Small stable appearing high attenuation in the right sylvian fissure, likely small subarachnoid hemorrhage, grossly stable compared to prior study. Follow-up recommended. No new hemorrhage identified. Electronically Signed   By: Elgie CollardArash  Radparvar M.D.   On: 01/29/2016 21:59   Ct Head Wo Contrast  2015/11/14  CLINICAL DATA:  Right MCA occlusion post cerebral angiogram/intervention. EXAM: CT HEAD WITHOUT CONTRAST TECHNIQUE: Contiguous axial images were obtained from the base of the skull through the vertex without intravenous contrast. COMPARISON:  02017/04/08 at 7:54 p.m. as well as 10/08/2014, CTA head with contrast 02017/04/08 at 18:07 p.m. FINDINGS: Ventricles and cisterns are within normal. Evidence of patient's  known bilateral old ACA territory infarcts. Mild increased density over the right MCA with mild increased density over the right temporal parietal gyri likely due to contrast  injection from patient's recent angiographic procedure. No mass, significant mass effect or shift midline structures. No CT changes of acute infarction. Remainder the exam is unchanged IMPRESSION: Mild increased density over the right MCA and mild hyperdensity over the gyri of the right MCA territory compatible with patient's known MCA occlusion and recent angiographic procedure/intervention. Otherwise, no significant change from recent prior exams. Electronically Signed   By: Elberta Fortis M.D.   On: 23-Feb-2016 22:02   Ct Head Wo Contrast  2016-02-23  ADDENDUM REPORT: 02-23-16 18:49 ADDENDUM: With regard to impression # 4, the following correction is noted: The chronic appearing bilateral ACA territory infarcts have occurred since March 2016. (not March 2017) Electronically Signed   By: Odessa Fleming M.D.   On: 02-23-2016 18:49  02-23-2016  CLINICAL DATA:  40 year old female code stroke. Left side weakness last seen normal at 1600 hours. Initial encounter. EXAM: CT HEAD WITHOUT CONTRAST TECHNIQUE: Contiguous axial images were obtained from the base of the skull through the vertex without intravenous contrast. COMPARISON:  CTA head and neck from today reported separately. FINDINGS: Hyperdense right MCA M1 (series 201, image 9). No CT changes of acute right MCA territory infarct. See CTA findings today reported separately. ASPECTS score = 10, with regard to the right MCA territory. [place] Stroke Program Early CT Score Normal score = 10 New bilateral ACA territory encephalomalacia since 10/09/2014. Mild posterior insula left MCA territory encephalomalacia corresponding to the March ischemia. Posterior fossa gray-white matter differentiation remains normal. No acute intracranial hemorrhage identified. No midline shift, mass effect, or evidence of intracranial mass lesion. No ventriculomegaly. No acute osseous abnormality identified. Stable and well pneumatized visualized paranasal sinuses and mastoids. Rightward gaze  deviation. Negative scalp soft tissues. IMPRESSION: 1. Hyperdense right M1 segment, see CTA findings reported separately. 2. No CT changes of right MCA infarct identified; ASPECTS = 10. No acute intracranial hemorrhage. 3. The above was discussed by telephone with Dr. Ritta Slot on 2016/02/23 at 1820 hours. 4. New but chronic appearing bilateral anterior ACA territory infarcts since March. Expected evolution of the left MCA insula infarcts seen at that time. Electronically Signed: By: Odessa Fleming M.D. 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 Code stroke with left side weakness. Hyperdense right MCA on noncontrast head CT. Left MCA infarcts demonstrated by MRI in 2016. Initial encounter. EXAM: CT ANGIOGRAPHY HEAD AND NECK TECHNIQUE: Multidetector CT imaging of the head and neck was performed using the standard protocol during bolus administration of intravenous contrast. Multiplanar CT image reconstructions and MIPs were obtained to evaluate the vascular anatomy. Carotid stenosis measurements (when applicable) are obtained utilizing NASCET criteria, using the distal internal carotid diameter as the denominator. CONTRAST:  50 mL Isovue-300 COMPARISON:  Head CT without contrast 1754 hours today, 10/09/2014 brain MRI and intracranial MRA. FINDINGS: CT HEAD Reported separately. CTA NECK Skeleton: Partially visible sequelae of median sternotomy. No acute osseous abnormality identified. Other neck: Negative lung apices. No superior mediastinal lymphadenopathy. Thyroid, larynx, pharynx, parapharyngeal spaces, retropharyngeal space, sublingual space, submandibular glands, parotid glands, and cervical lymph nodes are within normal limits. Aortic arch: 3 vessel arch configuration. No arch atherosclerosis or great vessel origin stenosis. Right carotid system: Mild motion artifact in the lower neck. Negative right CCA.  Negative right  carotid bifurcation. Negative cervical right ICA. Left carotid system: Mild motion artifact at the level of the lower left CCA which appears to remain normal. Left carotid bifurcation appears widely patent, but distal to the bulb the cervical left ICA is diminutive and remain so to the level of the skullbase. Left ICA caliber is about half that on the right throughout the neck. Vertebral arteries:No proximal subclavian artery stenosis. Normal vertebral artery origins, the right vertebral artery origin is somewhat early. Both vertebral arteries are patent to the skullbase without stenosis. CTA HEAD Posterior circulation: Codominant distal vertebral arteries are normal to the vertebrobasilar junction. Normal right PICA origin. Normal AICA origins. No basilar stenosis. SCA and PCA origins are normal. Diminutive posterior communicating arteries. Bilateral PCA branches are within normal limits. Anterior circulation: Diminutive left ICA siphon remains patent to the anterior genu, but the left ICA terminus appears functionally occluded. There are small left lenticulostriate and left MCA M2 region collaterals, but with little overall left MCA region enhancement. The left ACA A1 segment is chronically diminutive or absent. The right ICA siphon is patent to the right ICA terminus. The right ACA origin is normal. The left ACA is chronically or congenitally supplied from the right side. There is mild motion artifact along the proximal ACA is which appear patent. The distal ACA branches appear within normal limits. Right MCA origin is patent but the right M1 is occluded 11 mm beyond its origin (see series 407, image 45). There is some right M2 collateral enhancement in the sylvian fissure. Anatomic variants: Diminutive or absent left ACA A1 segment such that both ACA is are supplied from the right. IMPRESSION: 1. Positive for emergent large vessel occlusion: Right MCA M1 segment. Some collateral enhancement visible in the right  sylvian fissure. This was discussed by telephone with Dr. Ritta Slot on 02/04/16 At 1820 hours. 2. Occlusion of the left ICA terminus since the MRA on 10/09/2014. Faint collateral enhancement in the left lenticulostriate and left MCA territory. 3. Normal appearing bilateral ACAs, the left is supplied from the right due to diminutive or absent left A1. 4. No atherosclerosis or stenosis in the neck. Negative posterior circulation. Electronically Signed   By: Odessa Fleming M.D.   On: 02-04-2016 18:47   Mr Maxine Glenn Head Wo Contrast  01/29/2016  CLINICAL DATA:  Stroke. Sudden onset left-sided weakness with fall. Right M1 occlusion on CTA yesterday followed by endovascular treatment with complete revascularization. EXAM: MRI HEAD WITHOUT CONTRAST MRA HEAD WITHOUT CONTRAST TECHNIQUE: Multiplanar, multiecho pulse sequences of the brain and surrounding structures were obtained without intravenous contrast. Angiographic images of the head were obtained using MRA technique without contrast. COMPARISON:  Head CT and CTA 02/04/2016.  Brain MRI 10/09/2014. FINDINGS: MRI HEAD FINDINGS There is an acute right MCA/lateral lenticulostriate infarct involving the basal ganglia including essentially the entirety of the putamen, caudate body, and adjacent white matter tracts including corona radiata. In total this measures approximately 4 x 4 x 2.5 cm without evidence of associated hemorrhage. There are a few punctate foci of restricted diffusion scattered throughout the left cerebral hemisphere which may reflect tiny acute infarcts or be related to yesterday's procedure. There is susceptibility artifact and abnormal FLAIR signal within the right sylvian fissure consistent with small volume subarachnoid hemorrhage. Abnormal FLAIR signal is also present in multiple cerebral sulci, right greater than left, without associated susceptibility artifact. This may reflect additional subarachnoid blood products as well as possible artifact from  patient's ventilated/ hyperoxygenated  state. Subarachnoid blood products and/or contrast material was demonstrated in the right sylvian fissure and cerebral sulci on yesterday's postprocedure CT. There is mild mass effect on the right lateral ventricle from a edema from the acute infarct. There is no midline shift, mass, or extra-axial fluid collection. There are chronic bilateral frontal lobe ACA territory infarcts which are new from the prior MRI. Mild encephalomalacia is noted in the left corona radiata and left very insular region related to an old MCA infarct. Scattered foci of chronic microhemorrhage are again seen peripherally throughout both cerebral hemispheres and are stable to at most minimally increased in number from the prior MRI. A chronic microhemorrhage in the left cerebellum is unchanged. Orbits are unremarkable. Minimal paranasal sinus and right mastoid air cell edema. Abnormal appearance of the left MCA and distal ICA, more fully evaluated below. MRA HEAD FINDINGS The visualized distal vertebral arteries are widely patent to the basilar. Patent right PICA, bilateral AICA, and bilateral SCA origins. Widely patent basilar artery. Patent left posterior communicating artery. PCAs are patent without evidence of significant proximal stenosis or major branch occlusion. Asymmetric attenuation of distal right PCA branch vessels is slightly more prominent than on the prior MRA. The intracranial right ICA is patent without stenosis. The right MCA is now patent without evidence of major branch occlusion or significant proximal stenosis. The right ACA is patent without evidence of significant stenosis and supplies the left A2 segment. The left A1 segment may be congenitally absent. The the visualized distal cervical and intracranial segments of the left ICA are diminutive as seen on yesterday's CTA with occlusion at the level of the terminus. No significant flow related enhancement is present in the left A1  or left M1 segments. Small collateral vessels are noted in the left lateral lenticulostriate region with minimal flow related enhancement in some distal left MCA branches. No intracranial aneurysm is identified. IMPRESSION: 1. Acute nonhemorrhagic right MCA infarct involving the basal ganglia. 2. Small volume subarachnoid hemorrhage in the right sylvian fissure. Additional subarachnoid blood products versus artifactual FLAIR signal in bilateral cerebral sulci. 3. 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 4. Stable to minimally increased number of scattered chronic cerebral microhemorrhages. 5. Interval right MCA revascularization. 6. 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 Electronically Signed   By: Sebastian Ache M.D.   On: 01/29/2016 18:16   Mr Brain Wo Contrast  01/29/2016  CLINICAL DATA:  Stroke. Sudden onset left-sided weakness with fall. Right M1 occlusion on CTA yesterday followed by endovascular treatment with complete revascularization. EXAM: MRI HEAD WITHOUT CONTRAST MRA HEAD WITHOUT CONTRAST TECHNIQUE: Multiplanar, multiecho pulse sequences of the brain and surrounding structures were obtained without intravenous contrast. Angiographic images of the head were obtained using MRA technique without contrast. COMPARISON:  Head CT and CTA 2016/02/01.  Brain MRI 10/09/2014. FINDINGS: MRI HEAD FINDINGS There is an acute right MCA/lateral lenticulostriate infarct involving the basal ganglia including essentially the entirety of the putamen, caudate body, and adjacent white matter tracts including corona radiata. In total this measures approximately 4 x 4 x 2.5 cm without evidence of associated hemorrhage. There are a few punctate foci of restricted diffusion scattered throughout the left cerebral hemisphere which may reflect tiny acute infarcts or be related to yesterday's procedure. There is susceptibility artifact and abnormal FLAIR signal within the right sylvian fissure  consistent with small volume subarachnoid hemorrhage. Abnormal FLAIR signal is also present in multiple cerebral sulci, right greater than left, without associated  susceptibility artifact. This may reflect additional subarachnoid blood products as well as possible artifact from patient's ventilated/ hyperoxygenated state. Subarachnoid blood products and/or contrast material was demonstrated in the right sylvian fissure and cerebral sulci on yesterday's postprocedure CT. There is mild mass effect on the right lateral ventricle from a edema from the acute infarct. There is no midline shift, mass, or extra-axial fluid collection. There are chronic bilateral frontal lobe ACA territory infarcts which are new from the prior MRI. Mild encephalomalacia is noted in the left corona radiata and left very insular region related to an old MCA infarct. Scattered foci of chronic microhemorrhage are again seen peripherally throughout both cerebral hemispheres and are stable to at most minimally increased in number from the prior MRI. A chronic microhemorrhage in the left cerebellum is unchanged. Orbits are unremarkable. Minimal paranasal sinus and right mastoid air cell edema. Abnormal appearance of the left MCA and distal ICA, more fully evaluated below. MRA HEAD FINDINGS The visualized distal vertebral arteries are widely patent to the basilar. Patent right PICA, bilateral AICA, and bilateral SCA origins. Widely patent basilar artery. Patent left posterior communicating artery. PCAs are patent without evidence of significant proximal stenosis or major branch occlusion. Asymmetric attenuation of distal right PCA branch vessels is slightly more prominent than on the prior MRA. The intracranial right ICA is patent without stenosis. The right MCA is now patent without evidence of major branch occlusion or significant proximal stenosis. The right ACA is patent without evidence of significant stenosis and supplies the left A2 segment.  The left A1 segment may be congenitally absent. The the visualized distal cervical and intracranial segments of the left ICA are diminutive as seen on yesterday's CTA with occlusion at the level of the terminus. No significant flow related enhancement is present in the left A1 or left M1 segments. Small collateral vessels are noted in the left lateral lenticulostriate region with minimal flow related enhancement in some distal left MCA branches. No intracranial aneurysm is identified. IMPRESSION: 1. Acute nonhemorrhagic right MCA infarct involving the basal ganglia. 2. Small volume subarachnoid hemorrhage in the right sylvian fissure. Additional subarachnoid blood products versus artifactual FLAIR signal in bilateral cerebral sulci. 3. 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 4. Stable to minimally increased number of scattered chronic cerebral microhemorrhages. 5. Interval right MCA revascularization. 6. 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 Electronically Signed   By: Sebastian Ache M.D.   On: 01/29/2016 18:16   Dg Chest Port 1 View  01/30/2016  CLINICAL DATA:  Stroke, fever EXAM: PORTABLE CHEST 1 VIEW COMPARISON:  Portable exam 0926 hours compared to Feb 27, 2016 FINDINGS: Prior median sternotomy and AVR. Tip of endotracheal tube projects 3.0 cm above carina. Enlargement of cardiac silhouette. Pulmonary vascularity normal. Persistent atelectasis LEFT lower lobe, cannot exclude coexistent LEFT lower lobe infiltrate. Remaining lungs clear. No pleural effusion or pneumothorax. IMPRESSION: Persistent atelectasis LEFT lower lobe, cannot exclude coexistent infiltrate. When compared to previous exam, slightly increased LEFT lower lobe opacity is seen. Electronically Signed   By: Ulyses Southward M.D.   On: 01/30/2016 10:00   Dg Chest Port 1 View  02/27/16  CLINICAL DATA:  Encounter for intubation EXAM: PORTABLE CHEST - 1 VIEW COMPARISON:  10/08/2014 FINDINGS: Endotracheal tube tip  is below the thoracic inlet, approximately 6 cm above carina. Low lung volumes. Linear scarring or subsegmental atelectasis at the left lung base stable. Previous median sternotomy and valve surgery. Heart size upper limits normal. Surgical  clips in the right axilla. IMPRESSION: 1. Chronic left lower lobe scarring or atelectasis. No acute abnormality. 2. Endotracheal tube placement 6 cm above carina. Electronically Signed   By: Corlis Leak  Hassell M.D.   On: 2015/09/08 22:18    Labs:  CBC:  Recent Labs  Jul 19, 2016 1808 Jul 19, 2016 1810 01/29/16 0315 01/30/16 0532  WBC  --  19.9* 13.7* 12.8*  HGB 14.6 12.7 10.4* 9.5*  HCT 43.0 41.2 34.3* 32.3*  PLT  --  290 289 256    COAGS:  Recent Labs  Jul 19, 2016 1810  INR 1.55*  APTT 29    BMP:  Recent Labs  Jul 19, 2016 1808 Jul 19, 2016 1810 01/29/16 0315 01/30/16 0531  NA 141 139 137 137  K 3.9 3.9 3.6 3.3*  CL 106 107 110 108  CO2  --  23 22 23   GLUCOSE 123* 124* 113* 96  BUN 12 10 8 6   CALCIUM  --  9.7 8.5* 8.4*  CREATININE 0.80 0.91 0.74 0.70  GFRNONAA  --  >60 >60 >60  GFRAA  --  >60 >60 >60    LIVER FUNCTION TESTS:  Recent Labs  Jul 19, 2016 1810  BILITOT 0.5  AST 33  ALT 28  ALKPHOS 81  PROT 7.7  ALBUMIN 4.2    Assessment and Plan:  CVA Revascularization Rt MCA 6/22 Better daily   Electronically Signed: Princessa Lesmeister A 01/30/2016, 12:13 PM   I spent a total of 15 Minutes at the the patient's bedside AND on the patient's hospital floor or unit, greater than 50% of which was counseling/coordinating care for CVA/ Rt MCA Revasc

## 2016-01-30 NOTE — Progress Notes (Addendum)
SLP Cancellation Note  Patient Details Name: Nichole Black MRN: [id number] DOB: [birth year]   Cancelled treatment:       Reason Eval/Treat Not Completed: Patient not medically ready  Pt is still intubated but per nursing is weaning.  ST will follow up in 1-2 days for readiness for swallow evaluation.    Dimas AguasMelissa Ankur Snowdon, MA, CCC-SLP Acute Rehab SLP [phone] [name], [name] 01/30/2016, 10:36 AM
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
## 2016-01-30 NOTE — Progress Notes (Signed)
ANTICOAGULATION CONSULT NOTE - Follow Up Consult  Pharmacy Consult for Heparin  Indication: stroke, mechanical AVR  Allergies  Allergen Reactions  . Warfarin Sodium Itching and Other (See Comments)    Cannot take Generic, only brand name  . Hydrocodone Itching    Patient Measurements: Height: 5\' 3"  (160 cm) Weight: 214 lb 1.1 oz (97.1 kg) IBW/kg (Calculated) : 52.4  Vital Signs: Temp: 99.1 F (37.3 C) (06/24 1124) Temp Source: Oral (06/24 1124) BP: 123/72 mmHg (06/24 1145) Pulse Rate: 87 (06/24 1145)  Labs:  Recent Labs  01/24/2016 1810 01/29/16 0315 01/30/16 0531 01/30/16 0532 01/30/16 1310  HGB 12.7 10.4*  --  9.5*  --   HCT 41.2 34.3*  --  32.3*  --   PLT 290 289  --  256  --   APTT 29  --   --   --   --   LABPROT 18.6*  --   --   --   --   INR 1.55*  --   --   --   --   HEPARINUNFRC  --   --   --  <0.10* 0.15*  CREATININE 0.91 0.74 0.70  --   --   TROPONINI 0.05*  --   --   --   --     Estimated Creatinine Clearance: 104.8 mL/min (by C-G formula based on Cr of 0.7).   Assessment: 40 y/o F s/p tPA/IR for stroke on 6/22, on heparin drip given high risk with mechanical valve, neurology aware of MRI/CT results with small SAH, HL subtherapeutic, no issues per RN.   Goal of Therapy:  Heparin level 0.3-0.5 units/ml Monitor platelets by anticoagulation protocol: Yes   Plan:  -Increase heparin to 1100 units/hr -Level this evening   Baldemar FridayMasters, Tremaine Fuhriman M 01/30/2016,2:20 PM

## 2016-01-30 NOTE — Progress Notes (Signed)
Wasted 35 ml of fentanyl in the sink. Witnessed by Jani FilesJohn Thomasson, RN and Docia BarrierJosh Sharell Hilmer, RN

## 2016-01-31 ENCOUNTER — Inpatient Hospital Stay (HOSPITAL_COMMUNITY): Payer: 59

## 2016-01-31 DIAGNOSIS — I63511 Cerebral infarction due to unspecified occlusion or stenosis of right middle cerebral artery: Secondary | ICD-10-CM | POA: Insufficient documentation

## 2016-01-31 LAB — RPR: RPR: NONREACTIVE

## 2016-01-31 LAB — CBC
HEMATOCRIT: 33 % — AB (ref 36.0–46.0)
Hemoglobin: 9.9 g/dL — ABNORMAL LOW (ref 12.0–15.0)
MCH: 23.7 pg — ABNORMAL LOW (ref 26.0–34.0)
MCHC: 30 g/dL (ref 30.0–36.0)
MCV: 79.1 fL (ref 78.0–100.0)
PLATELETS: 292 10*3/uL (ref 150–400)
RBC: 4.17 MIL/uL (ref 3.87–5.11)
RDW: 16.6 % — AB (ref 11.5–15.5)
WBC: 11.6 10*3/uL — AB (ref 4.0–10.5)

## 2016-01-31 LAB — BASIC METABOLIC PANEL
Anion gap: 8 (ref 5–15)
BUN: 5 mg/dL — AB (ref 6–20)
CALCIUM: 8.9 mg/dL (ref 8.9–10.3)
CHLORIDE: 107 mmol/L (ref 101–111)
CO2: 24 mmol/L (ref 22–32)
CREATININE: 0.66 mg/dL (ref 0.44–1.00)
Glucose, Bld: 102 mg/dL — ABNORMAL HIGH (ref 65–99)
Potassium: 3.3 mmol/L — ABNORMAL LOW (ref 3.5–5.1)
SODIUM: 139 mmol/L (ref 135–145)

## 2016-01-31 LAB — HEPARIN LEVEL (UNFRACTIONATED): HEPARIN UNFRACTIONATED: 0.15 [IU]/mL — AB (ref 0.30–0.70)

## 2016-01-31 LAB — HIV ANTIBODY (ROUTINE TESTING W REFLEX): HIV SCREEN 4TH GENERATION: NONREACTIVE

## 2016-01-31 NOTE — Evaluation (Signed)
Clinical/Bedside Swallow Evaluation Patient Details  Name: Nichole Black MRN: [id number] Date of Birth: 08-23-1975  Today's Date: 01/31/2016 Time: SLP Start Time (ACUTE ONLY): 40980905 SLP Stop Time (ACUTE ONLY): 11910922 SLP Time Calculation (min) (ACUTE ONLY): 17 min  Past Medical History:  Past Medical History  Diagnosis Date  . S/P aortic valve replacement with bioprosthetic valve   . Stroke [facility](HCC)    Past Surgical History:  Past Surgical History  Procedure Laterality Date  . Carbomedic top hat supra-annular aortic valve    . Radiology with anesthesia N/A 10/08/2014    Procedure: RADIOLOGY WITH ANESTHESIA;  Surgeon: Oneal GroutSanjeev K Deveshwar, MD;  Location: [facility] OR;  Service: Radiology;  Laterality: N/A;  . Radiology with anesthesia N/A 01/17/2016    Procedure: RADIOLOGY WITH ANESTHESIA;  Surgeon: Julieanne CottonSanjeev Deveshwar, MD;  Location: [facility] OR;  Service: Radiology;  Laterality: N/A;   HPI:  Pt is a 40 y.o. female who had sudden onset L side weakness on 6/22. MRI showed acute nonhemorrhagic R CVA involving basal ganglia, small subarachnoid hemorrhage R sylvian fissure, additional subarachnoid blood products versus artifactual FLAIR signal in bilateral cerebral sulci, stable to minimally increased number of scattered chronic cerebral microhemorrhages. CXR showed persistent atelectasis LLL, no pleural effusion. Pt was intubated 6/22 to 6/24. Pt with hx of stroke 10/2014- L MCA occlusion. Bedside swallow ordered for stroke workup and post-extubation.    Assessment / Plan / Recommendation Clinical Impression  Pt with overt s/s of aspiration on puree trials- immediate cough with oral residuals on L side. Pt did not show overt s/s on small thin liquid trials by straw; however, suspect delayed swallow initiation/ delayed oral transit of bolus with possible decreased sensation following stroke and 3-day intubation. Pt lethargic at time of evaluation and needing cues to stay awake/ increase sustained attention to bolus. Pt  able to follow functional 1-step directions and able to answer orientation questions verbally in short phrases. Given s/s at bedside along with cognitive and post-extubation status, aspiration risk appears high at this time. Recommend that pt continue to be NPO with meds via alternative means. Will continue to follow for PO trials/ consideration of objective evaluation. Please order speech-language evaluation for this pt as well.    Aspiration Risk  Severe aspiration risk    Diet Recommendation NPO   Medication Administration: Via alternative means    Other  Recommendations Oral Care Recommendations: Oral care QID   Follow up Recommendations  Other (comment) (TBD)    Frequency and Duration min 2x/week  2 weeks       Prognosis Prognosis for Safe Diet Advancement: Good      Swallow Study   General HPI: Pt is a 40 y.o. female who had sudden onset L side weakness on 6/22. MRI showed acute nonhemorrhagic R CVA involving basal ganglia, small subarachnoid hemorrhage R sylvian fissure, additional subarachnoid blood products versus artifactual FLAIR signal in bilateral cerebral sulci, stable to minimally increased number of scattered chronic cerebral microhemorrhages. CXR showed persistent atelectasis LLL, no pleural effusion. Pt was intubated 6/22 to 6/24. Pt with hx of stroke 10/2014- L MCA occlusion. Bedside swallow ordered for stroke workup and post-extubation.  Type of Study: Bedside Swallow Evaluation Diet Prior to this Study: NPO Temperature Spikes Noted: Yes (low grade) Respiratory Status: Nasal cannula History of Recent Intubation: Yes Length of Intubations (days): 2 days Date extubated: 01/30/16 Behavior/Cognition: Cooperative;Lethargic/Drowsy Oral Cavity Assessment: Within Functional Limits Oral Cavity - Dentition: Adequate natural dentition Self-Feeding Abilities: Needs assist Patient Positioning: Upright in  bed Baseline Vocal Quality: Low vocal intensity Volitional Cough:  Weak Volitional Swallow: Unable to elicit    Oral/Motor/Sensory Function Overall Oral Motor/Sensory Function: Moderate impairment Facial ROM: Reduced left Facial Symmetry: Abnormal symmetry left Facial Strength: Reduced left Facial Sensation: Within Functional Limits Lingual ROM: Within Functional Limits Lingual Symmetry: Within Functional Limits   Ice Chips Ice chips: Impaired Presentation: Spoon Pharyngeal Phase Impairments: Suspected delayed Swallow   Thin Liquid Thin Liquid: Impaired Presentation: Straw Oral Phase Impairments: Poor awareness of bolus Oral Phase Functional Implications: Prolonged oral transit Pharyngeal  Phase Impairments: Suspected delayed Swallow;Multiple swallows    Nectar Thick Nectar Thick Liquid: Not tested   Honey Thick Honey Thick Liquid: Not tested   Puree Puree: Impaired Presentation: Spoon Oral Phase Impairments: Reduced lingual movement/coordination Oral Phase Functional Implications: Left lateral sulci pocketing;Oral residue;Prolonged oral transit;Left anterior spillage Pharyngeal Phase Impairments: Suspected delayed Swallow;Cough - Immediate   Solid   GO   Solid: Not tested        [place]ro [place], Bright Spielmann K, MA, CCC-SLP 01/31/2016,9:35 AM [phone]

## 2016-01-31 NOTE — Progress Notes (Signed)
ANTICOAGULATION CONSULT NOTE - Follow Up Consult  Pharmacy Consult for Heparin  Indication: stroke, mechanical AVR  Allergies  Allergen Reactions  . Warfarin Sodium Itching and Other (See Comments)    Cannot take Generic, only brand name  . Hydrocodone Itching    Patient Measurements: Height: 5\' 3"  (160 cm) Weight: 223 lb 15.8 oz (101.6 kg) IBW/kg (Calculated) : 52.4  Vital Signs: Temp: 98.4 F (36.9 C) (06/25 0805) Temp Source: Oral (06/25 0805) BP: 132/68 mmHg (06/25 0805) Pulse Rate: 75 (06/25 0805)  Labs:  Recent Labs  01/14/2016 1810 01/29/16 0315 01/30/16 0531  01/30/16 0532 01/30/16 1310 01/30/16 1954 01/31/16 0656  HGB 12.7 10.4*  --   --  9.5*  --   --  9.9*  HCT 41.2 34.3*  --   --  32.3*  --   --  33.0*  PLT 290 289  --   --  256  --   --  292  APTT 29  --   --   --   --   --   --   --   LABPROT 18.6*  --   --   --   --   --   --   --   INR 1.55*  --   --   --   --   --   --   --   HEPARINUNFRC  --   --   --   < > <0.10* 0.15* 0.95* <0.10*  CREATININE 0.91 0.74 0.70  --   --   --   --  0.66  TROPONINI 0.05*  --   --   --   --   --   --   --   < > = values in this interval not displayed.  Estimated Creatinine Clearance: 107.5 mL/min (by C-G formula based on Cr of 0.66).   Assessment: 40 y/o F s/p tPA/IR for stroke on 6/22, on heparin drip given high risk with mechanical valve, neurology aware of MRI/CT results with small SAH.  Heparin held and restarted at 950 units/hr overnight due to SUPRAtherapeutic level. It was confirmed with RN at that time that level was drawn correctly. HL today now <0.10. RN confirmed current rate with no infusion issues. CBC stable. No overt bleeding reported.   Goal of Therapy:  Heparin level 0.3-0.5 units/ml Monitor platelets by anticoagulation protocol: Yes   Plan:  -Increase heparin to 1100 units/hr -6hr HL -Daily HL, CBC -Monitor s/sx bleeding   Sherle Poeob Vincent, PharmD Clinical Pharmacy Resident 9:40 AM,  01/31/2016
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
## 2016-01-31 NOTE — Progress Notes (Signed)
Patient arrived to 3S09 via bed from 41M.  Pt transferred to stepdown bed and hooked up to monitors.  [facility] notified of patient's arrival.  Pt appears calm and resting, she has been oriented to room and call bell placed within reach.  Will continue to monitor.

## 2016-01-31 NOTE — Progress Notes (Signed)
Pt continues with tongue midline with equal bilateral movement.
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
## 2016-01-31 NOTE — Progress Notes (Signed)
Rehab Admissions Coordinator Note:  Patient was screened by Clois DupesBoyette, Ashey Tramontana Godwin for appropriateness for an Inpatient Acute Rehab Consult.  At this time, we are recommending Inpatient Rehab consult.  Clois DupesBoyette, Camry Robello Godwin 01/31/2016, 1:49 PM  I can be reached at [phone].

## 2016-01-31 NOTE — Progress Notes (Signed)
Referring Physician(s): Dr Marvel Plan  Supervising Physician: Julieanne Cotton  Patient Status:  Inpatient  Chief Complaint:  CVA Rt MCA clot retrieval with IA Tpa 6/22  Subjective:  Better daily Still with difficulty to speak; was able to articulate  "Trent"--her sons name Able to communicate with nods; eyes and thumbs up/down Denies pain or headache  Allergies: Warfarin sodium and Hydrocodone  Medications: Prior to Admission medications   Medication Sig Start Date End Date Taking? Authorizing Provider  amitriptyline (ELAVIL) 25 MG tablet Take 1 tablet (25 mg total) by mouth at bedtime. Reported on 08/27/2015 08/27/15 08/26/16 Yes Nilda Riggs, NP  topiramate (TOPAMAX) 25 MG tablet Take 1 tablet (25 mg total) by mouth 2 (two) times daily. Patient taking differently: Take 25 mg by mouth 2 (two) times daily as needed.  02/11/15  Yes Micki Riley, MD  warfarin (COUMADIN) 4 MG tablet daily 01/08/15  Yes Historical Provider, MD  pravastatin (PRAVACHOL) 20 MG tablet Take 1 tablet (20 mg total) by mouth daily at 6 PM. Patient not taking: Reported on 08/27/2015 10/13/14   Micki Riley, MD     Vital Signs: BP 132/68 mmHg  Pulse 75  Temp(Src) 98.4 F (36.9 C) (Oral)  Resp 13  Ht 5\' 3"  (1.6 m)  Wt 223 lb 15.8 oz (101.6 kg)  BMI 39.69 kg/m2  SpO2 99%  LMP 12/28/2015 (Approximate)  Physical Exam  Musculoskeletal: Normal range of motion.  Definite slower and weaker on left  Neurological: She is alert.  Able to follow all commands Verbal communication is only few words Does nod and thumb sign appropriately to questions  Skin: Skin is warm.  Nursing note and vitals reviewed.   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 Code stroke with left side weakness. Hyperdense right MCA on noncontrast head CT. Left MCA infarcts demonstrated by MRI in 2016. Initial encounter. EXAM: CT ANGIOGRAPHY HEAD  AND NECK TECHNIQUE: Multidetector CT imaging of the head and neck was performed using the standard protocol during bolus administration of intravenous contrast. Multiplanar CT image reconstructions and MIPs were obtained to evaluate the vascular anatomy. Carotid stenosis measurements (when applicable) are obtained utilizing NASCET criteria, using the distal internal carotid diameter as the denominator. CONTRAST:  50 mL Isovue-300 COMPARISON:  Head CT without contrast 1754 hours today, 10/09/2014 brain MRI and intracranial MRA. FINDINGS: CT HEAD Reported separately. CTA NECK Skeleton: Partially visible sequelae of median sternotomy. No acute osseous abnormality identified. Other neck: Negative lung apices. No superior mediastinal lymphadenopathy. Thyroid, larynx, pharynx, parapharyngeal spaces, retropharyngeal space, sublingual space, submandibular glands, parotid glands, and cervical lymph nodes are within normal limits. Aortic arch: 3 vessel arch configuration. No arch atherosclerosis or great vessel origin stenosis. Right carotid system: Mild motion artifact in the lower neck. Negative right CCA. Negative right carotid bifurcation. Negative cervical right ICA. Left carotid system: Mild motion artifact at the level of the lower left CCA which appears to remain normal. Left carotid bifurcation appears widely patent, but distal to the bulb the cervical left ICA is diminutive and remain so to the level of the skullbase. Left ICA caliber is about half that on the right throughout the neck. Vertebral arteries:No proximal subclavian artery stenosis. Normal vertebral artery origins, the right vertebral artery origin is somewhat early. Both vertebral arteries are patent to the skullbase without stenosis. CTA HEAD Posterior circulation: Codominant distal vertebral arteries are normal to the  vertebrobasilar junction. Normal right PICA origin. Normal AICA origins. No basilar stenosis. SCA and PCA origins are normal.  Diminutive posterior communicating arteries. Bilateral PCA branches are within normal limits. Anterior circulation: Diminutive left ICA siphon remains patent to the anterior genu, but the left ICA terminus appears functionally occluded. There are small left lenticulostriate and left MCA M2 region collaterals, but with little overall left MCA region enhancement. The left ACA A1 segment is chronically diminutive or absent. The right ICA siphon is patent to the right ICA terminus. The right ACA origin is normal. The left ACA is chronically or congenitally supplied from the right side. There is mild motion artifact along the proximal ACA is which appear patent. The distal ACA branches appear within normal limits. Right MCA origin is patent but the right M1 is occluded 11 mm beyond its origin (see series 407, image 45). There is some right M2 collateral enhancement in the sylvian fissure. Anatomic variants: Diminutive or absent left ACA A1 segment such that both ACA is are supplied from the right. IMPRESSION: 1. Positive for emergent large vessel occlusion: Right MCA M1 segment. Some collateral enhancement visible in the right sylvian fissure. This was discussed by telephone with Dr. Ritta Slot on 01/27/2016 At 1820 hours. 2. Occlusion of the left ICA terminus since the MRA on 10/09/2014. Faint collateral enhancement in the left lenticulostriate and left MCA territory. 3. Normal appearing bilateral ACAs, the left is supplied from the right due to diminutive or absent left A1. 4. No atherosclerosis or stenosis in the neck. Negative posterior circulation. Electronically Signed   By: Odessa Fleming M.D.   On: 02/01/2016 18:47   Ct Head Wo Contrast  01/30/2016  CLINICAL DATA:  Subarachnoid hemorrhage. Acute right MCA infarct status post tPA and endovascular therapy. EXAM: CT HEAD WITHOUT CONTRAST TECHNIQUE: Contiguous axial images were obtained from the base of the skull through the vertex without intravenous contrast.  COMPARISON:  01/29/2016 FINDINGS: Subarachnoid hemorrhage in the right sylvian fissure has mildly increased in volume compared to yesterday's study. There may be a punctate focus of subarachnoid blood in the left sylvian fissure. Cytotoxic edema in the right basal ganglia from known infarct is unchanged. There is no evidence of new infarct, midline shift, or extra-axial fluid collection. 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 Mass effect on the frontal horn of the right lateral ventricle is unchanged. Orbits are unremarkable. Visualized paranasal sinuses and mastoid air cells are clear. IMPRESSION: 1. Mildly increased volume of right sylvian fissure subarachnoid hemorrhage. 2. Unchanged acute right basal ganglia infarct. Electronically Signed   By: Sebastian Ache M.D.   On: 01/30/2016 16:23   Ct Head Wo Contrast  01/29/2016  CLINICAL DATA:  40 year old female recent CVA treated with tPA. EXAM: CT HEAD WITHOUT CONTRAST TECHNIQUE: Contiguous axial images were obtained from the base of the skull through the vertex without intravenous contrast. COMPARISON:  Head CT dated 02/04/2016 and MRI dated 01/29/2016 FINDINGS: High attenuating material primarily within the right sylvian fissure appears grossly stable and likely represents a small subarachnoid hemorrhage. No new hemorrhage identified. An area of hypodensity in the right lentiform nucleus represent edema and corresponds to the infarcts seen on the MRI. There is resulting mild mass effect and mild compression of the right lateral ventricle. No midline shift. Stable appearing areas hypodensity in the frontal lobes along the anterior falx correspond to the old ACA territory infarct. Faint linear high attenuation over  posterior parietal convexities may represent volume averaging subarachnoid hemorrhage. The visualized paranasal sinuses mastoid air cells are clear. The calvarium is intact.  IMPRESSION: Right basal ganglia infarct with edema and mild mass effect on the right lateral ventricle. No midline shift. Small stable appearing high attenuation in the right sylvian fissure, likely small subarachnoid hemorrhage, grossly stable compared to prior study. Follow-up recommended. No new hemorrhage identified. Electronically Signed   By: Elgie Collard M.D.   On: 01/29/2016 21:59   Ct Head Wo Contrast  Jan 29, 2016  CLINICAL DATA:  Right MCA occlusion post cerebral angiogram/intervention. EXAM: CT HEAD WITHOUT CONTRAST TECHNIQUE: Contiguous axial images were obtained from the base of the skull through the vertex without intravenous contrast. COMPARISON:  01-29-2016 at 7:54 p.m. as well as 10/08/2014, CTA head with contrast 29-Jan-2016 at 18:07 p.m. FINDINGS: Ventricles and cisterns are within normal. Evidence of patient's known bilateral old ACA territory infarcts. Mild increased density over the right MCA with mild increased density over the right temporal parietal gyri likely due to contrast injection from patient's recent angiographic procedure. No mass, significant mass effect or shift midline structures. No CT changes of acute infarction. Remainder the exam is unchanged IMPRESSION: Mild increased density over the right MCA and mild hyperdensity over the gyri of the right MCA territory compatible with patient's known MCA occlusion and recent angiographic procedure/intervention. Otherwise, no significant change from recent prior exams. Electronically Signed   By: Elberta Fortis M.D.   On: January 29, 2016 22:02   Ct Head Wo Contrast  2016-01-29  ADDENDUM REPORT: 01-29-2016 18:49 ADDENDUM: With regard to impression # 4, the following correction is noted: The chronic appearing bilateral ACA territory infarcts have occurred since March 2016. (not March 2017) Electronically Signed   By: Odessa Fleming M.D.   On: 2016-01-29 18:49  01/29/16  CLINICAL DATA:  40 year old female code stroke. Left side weakness last  seen normal at 1600 hours. Initial encounter. EXAM: CT HEAD WITHOUT CONTRAST TECHNIQUE: Contiguous axial images were obtained from the base of the skull through the vertex without intravenous contrast. COMPARISON:  CTA head and neck from today reported separately. FINDINGS: Hyperdense right MCA M1 (series 201, image 9). No CT changes of acute right MCA territory infarct. See CTA findings today reported separately. ASPECTS score = 10, with regard to the right MCA territory. [place] Stroke Program Early CT Score Normal score = 10 New bilateral ACA territory encephalomalacia since 10/09/2014. Mild posterior insula left MCA territory encephalomalacia corresponding to the March ischemia. Posterior fossa gray-white matter differentiation remains normal. No acute intracranial hemorrhage identified. No midline shift, mass effect, or evidence of intracranial mass lesion. No ventriculomegaly. No acute osseous abnormality identified. Stable and well pneumatized visualized paranasal sinuses and mastoids. Rightward gaze deviation. Negative scalp soft tissues. IMPRESSION: 1. Hyperdense right M1 segment, see CTA findings reported separately. 2. No CT changes of right MCA infarct identified; ASPECTS = 10. No acute intracranial hemorrhage. 3. The above was discussed by telephone with Dr. Ritta Slot on 29-Jan-2016 at 1820 hours. 4. New but chronic appearing bilateral anterior ACA territory infarcts since March. Expected evolution of the left MCA insula infarcts seen at that time. Electronically Signed: By: Odessa Fleming M.D. 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 Code stroke with left side weakness. Hyperdense right MCA on noncontrast head CT. Left MCA infarcts demonstrated by MRI in 2016. Initial encounter. EXAM: CT ANGIOGRAPHY HEAD AND  NECK TECHNIQUE: Multidetector CT imaging of the head and neck was performed using the standard protocol  during bolus administration of intravenous contrast. Multiplanar CT image reconstructions and MIPs were obtained to evaluate the vascular anatomy. Carotid stenosis measurements (when applicable) are obtained utilizing NASCET criteria, using the distal internal carotid diameter as the denominator. CONTRAST:  50 mL Isovue-300 COMPARISON:  Head CT without contrast 1754 hours today, 10/09/2014 brain MRI and intracranial MRA. FINDINGS: CT HEAD Reported separately. CTA NECK Skeleton: Partially visible sequelae of median sternotomy. No acute osseous abnormality identified. Other neck: Negative lung apices. No superior mediastinal lymphadenopathy. Thyroid, larynx, pharynx, parapharyngeal spaces, retropharyngeal space, sublingual space, submandibular glands, parotid glands, and cervical lymph nodes are within normal limits. Aortic arch: 3 vessel arch configuration. No arch atherosclerosis or great vessel origin stenosis. Right carotid system: Mild motion artifact in the lower neck. Negative right CCA. Negative right carotid bifurcation. Negative cervical right ICA. Left carotid system: Mild motion artifact at the level of the lower left CCA which appears to remain normal. Left carotid bifurcation appears widely patent, but distal to the bulb the cervical left ICA is diminutive and remain so to the level of the skullbase. Left ICA caliber is about half that on the right throughout the neck. Vertebral arteries:No proximal subclavian artery stenosis. Normal vertebral artery origins, the right vertebral artery origin is somewhat early. Both vertebral arteries are patent to the skullbase without stenosis. CTA HEAD Posterior circulation: Codominant distal vertebral arteries are normal to the vertebrobasilar junction. Normal right PICA origin. Normal AICA origins. No basilar stenosis. SCA and PCA origins are normal. Diminutive posterior communicating arteries. Bilateral PCA branches are within normal limits. Anterior circulation:  Diminutive left ICA siphon remains patent to the anterior genu, but the left ICA terminus appears functionally occluded. There are small left lenticulostriate and left MCA M2 region collaterals, but with little overall left MCA region enhancement. The left ACA A1 segment is chronically diminutive or absent. The right ICA siphon is patent to the right ICA terminus. The right ACA origin is normal. The left ACA is chronically or congenitally supplied from the right side. There is mild motion artifact along the proximal ACA is which appear patent. The distal ACA branches appear within normal limits. Right MCA origin is patent but the right M1 is occluded 11 mm beyond its origin (see series 407, image 45). There is some right M2 collateral enhancement in the sylvian fissure. Anatomic variants: Diminutive or absent left ACA A1 segment such that both ACA is are supplied from the right. IMPRESSION: 1. Positive for emergent large vessel occlusion: Right MCA M1 segment. Some collateral enhancement visible in the right sylvian fissure. This was discussed by telephone with Dr. Ritta SlotMCNEILL KIRKPATRICK on 01/07/2016 At 1820 hours. 2. Occlusion of the left ICA terminus since the MRA on 10/09/2014. Faint collateral enhancement in the left lenticulostriate and left MCA territory. 3. Normal appearing bilateral ACAs, the left is supplied from the right due to diminutive or absent left A1. 4. No atherosclerosis or stenosis in the neck. Negative posterior circulation. Electronically Signed   By: Odessa FlemingH  Hall M.D.   On: 01/19/2016 18:47   Mr Maxine GlennMra Head Wo Contrast  01/29/2016  CLINICAL DATA:  Stroke. Sudden onset left-sided weakness with fall. Right M1 occlusion on CTA yesterday followed by endovascular treatment with complete revascularization. EXAM: MRI HEAD WITHOUT CONTRAST MRA HEAD WITHOUT CONTRAST TECHNIQUE: Multiplanar, multiecho pulse sequences of the brain and surrounding structures were obtained without intravenous contrast. Angiographic  images  of the head were obtained using MRA technique without contrast. COMPARISON:  Head CT and CTA 01/10/2016.  Brain MRI 10/09/2014. FINDINGS: MRI HEAD FINDINGS There is an acute right MCA/lateral lenticulostriate infarct involving the basal ganglia including essentially the entirety of the putamen, caudate body, and adjacent white matter tracts including corona radiata. In total this measures approximately 4 x 4 x 2.5 cm without evidence of associated hemorrhage. There are a few punctate foci of restricted diffusion scattered throughout the left cerebral hemisphere which may reflect tiny acute infarcts or be related to yesterday's procedure. There is susceptibility artifact and abnormal FLAIR signal within the right sylvian fissure consistent with small volume subarachnoid hemorrhage. Abnormal FLAIR signal is also present in multiple cerebral sulci, right greater than left, without associated susceptibility artifact. This may reflect additional subarachnoid blood products as well as possible artifact from patient's ventilated/ hyperoxygenated state. Subarachnoid blood products and/or contrast material was demonstrated in the right sylvian fissure and cerebral sulci on yesterday's postprocedure CT. There is mild mass effect on the right lateral ventricle from a edema from the acute infarct. There is no midline shift, mass, or extra-axial fluid collection. There are chronic bilateral frontal lobe ACA territory infarcts which are new from the prior MRI. Mild encephalomalacia is noted in the left corona radiata and left very insular region related to an old MCA infarct. Scattered foci of chronic microhemorrhage are again seen peripherally throughout both cerebral hemispheres and are stable to at most minimally increased in number from the prior MRI. A chronic microhemorrhage in the left cerebellum is unchanged. Orbits are unremarkable. Minimal paranasal sinus and right mastoid air cell edema. Abnormal appearance of  the left MCA and distal ICA, more fully evaluated below. MRA HEAD FINDINGS The visualized distal vertebral arteries are widely patent to the basilar. Patent right PICA, bilateral AICA, and bilateral SCA origins. Widely patent basilar artery. Patent left posterior communicating artery. PCAs are patent without evidence of significant proximal stenosis or major branch occlusion. Asymmetric attenuation of distal right PCA branch vessels is slightly more prominent than on the prior MRA. The intracranial right ICA is patent without stenosis. The right MCA is now patent without evidence of major branch occlusion or significant proximal stenosis. The right ACA is patent without evidence of significant stenosis and supplies the left A2 segment. The left A1 segment may be congenitally absent. The the visualized distal cervical and intracranial segments of the left ICA are diminutive as seen on yesterday's CTA with occlusion at the level of the terminus. No significant flow related enhancement is present in the left A1 or left M1 segments. Small collateral vessels are noted in the left lateral lenticulostriate region with minimal flow related enhancement in some distal left MCA branches. No intracranial aneurysm is identified. IMPRESSION: 1. Acute nonhemorrhagic right MCA infarct involving the basal ganglia. 2. Small volume subarachnoid hemorrhage in the right sylvian fissure. Additional subarachnoid blood products versus artifactual FLAIR signal in bilateral cerebral sulci. 3. 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 4. Stable to minimally increased number of scattered chronic cerebral microhemorrhages. 5. Interval right MCA revascularization. 6. 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 Electronically Signed   By: Sebastian Ache M.D.   On: 01/29/2016 18:16   Mr Brain Wo Contrast  01/29/2016  CLINICAL DATA:  Stroke. Sudden onset left-sided weakness with fall. Right M1 occlusion on CTA  yesterday followed by endovascular treatment with complete revascularization. EXAM: MRI HEAD WITHOUT CONTRAST MRA HEAD WITHOUT CONTRAST  TECHNIQUE: Multiplanar, multiecho pulse sequences of the brain and surrounding structures were obtained without intravenous contrast. Angiographic images of the head were obtained using MRA technique without contrast. COMPARISON:  Head CT and CTA 30-Jan-2016.  Brain MRI 10/09/2014. FINDINGS: MRI HEAD FINDINGS There is an acute right MCA/lateral lenticulostriate infarct involving the basal ganglia including essentially the entirety of the putamen, caudate body, and adjacent white matter tracts including corona radiata. In total this measures approximately 4 x 4 x 2.5 cm without evidence of associated hemorrhage. There are a few punctate foci of restricted diffusion scattered throughout the left cerebral hemisphere which may reflect tiny acute infarcts or be related to yesterday's procedure. There is susceptibility artifact and abnormal FLAIR signal within the right sylvian fissure consistent with small volume subarachnoid hemorrhage. Abnormal FLAIR signal is also present in multiple cerebral sulci, right greater than left, without associated susceptibility artifact. This may reflect additional subarachnoid blood products as well as possible artifact from patient's ventilated/ hyperoxygenated state. Subarachnoid blood products and/or contrast material was demonstrated in the right sylvian fissure and cerebral sulci on yesterday's postprocedure CT. There is mild mass effect on the right lateral ventricle from a edema from the acute infarct. There is no midline shift, mass, or extra-axial fluid collection. There are chronic bilateral frontal lobe ACA territory infarcts which are new from the prior MRI. Mild encephalomalacia is noted in the left corona radiata and left very insular region related to an old MCA infarct. Scattered foci of chronic microhemorrhage are again seen peripherally  throughout both cerebral hemispheres and are stable to at most minimally increased in number from the prior MRI. A chronic microhemorrhage in the left cerebellum is unchanged. Orbits are unremarkable. Minimal paranasal sinus and right mastoid air cell edema. Abnormal appearance of the left MCA and distal ICA, more fully evaluated below. MRA HEAD FINDINGS The visualized distal vertebral arteries are widely patent to the basilar. Patent right PICA, bilateral AICA, and bilateral SCA origins. Widely patent basilar artery. Patent left posterior communicating artery. PCAs are patent without evidence of significant proximal stenosis or major branch occlusion. Asymmetric attenuation of distal right PCA branch vessels is slightly more prominent than on the prior MRA. The intracranial right ICA is patent without stenosis. The right MCA is now patent without evidence of major branch occlusion or significant proximal stenosis. The right ACA is patent without evidence of significant stenosis and supplies the left A2 segment. The left A1 segment may be congenitally absent. The the visualized distal cervical and intracranial segments of the left ICA are diminutive as seen on yesterday's CTA with occlusion at the level of the terminus. No significant flow related enhancement is present in the left A1 or left M1 segments. Small collateral vessels are noted in the left lateral lenticulostriate region with minimal flow related enhancement in some distal left MCA branches. No intracranial aneurysm is identified. IMPRESSION: 1. Acute nonhemorrhagic right MCA infarct involving the basal ganglia. 2. Small volume subarachnoid hemorrhage in the right sylvian fissure. Additional subarachnoid blood products versus artifactual FLAIR signal in bilateral cerebral sulci. 3. 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 4. Stable to minimally increased number of scattered chronic cerebral microhemorrhages. 5. Interval right MCA revascularization.  6. 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 Electronically Signed   By: Sebastian Ache M.D.   On: 01/29/2016 18:16   Dg Chest Port 1 View  01/30/2016  CLINICAL DATA:  Stroke, fever EXAM: PORTABLE CHEST 1 VIEW COMPARISON:  Portable  exam 0926 hours compared to 01/07/2016 FINDINGS: Prior median sternotomy and AVR. Tip of endotracheal tube projects 3.0 cm above carina. Enlargement of cardiac silhouette. Pulmonary vascularity normal. Persistent atelectasis LEFT lower lobe, cannot exclude coexistent LEFT lower lobe infiltrate. Remaining lungs clear. No pleural effusion or pneumothorax. IMPRESSION: Persistent atelectasis LEFT lower lobe, cannot exclude coexistent infiltrate. When compared to previous exam, slightly increased LEFT lower lobe opacity is seen. Electronically Signed   By: Ulyses SouthwardMark  Boles M.D.   On: 01/30/2016 10:00   Dg Chest Port 1 View  01/26/2016  CLINICAL DATA:  Encounter for intubation EXAM: PORTABLE CHEST - 1 VIEW COMPARISON:  10/08/2014 FINDINGS: Endotracheal tube tip is below the thoracic inlet, approximately 6 cm above carina. Low lung volumes. Linear scarring or subsegmental atelectasis at the left lung base stable. Previous median sternotomy and valve surgery. Heart size upper limits normal. Surgical clips in the right axilla. IMPRESSION: 1. Chronic left lower lobe scarring or atelectasis. No acute abnormality. 2. Endotracheal tube placement 6 cm above carina. Electronically Signed   By: Corlis Leak  Hassell M.D.   On: 02/04/2016 22:18    Labs:  CBC:  Recent Labs  02/05/2016 1810 01/29/16 0315 01/30/16 0532 01/31/16 0656  WBC 19.9* 13.7* 12.8* 11.6*  HGB 12.7 10.4* 9.5* 9.9*  HCT 41.2 34.3* 32.3* 33.0*  PLT 290 289 256 292    COAGS:  Recent Labs  01/11/2016 1810  INR 1.55*  APTT 29    BMP:  Recent Labs  01/07/2016 1810 01/29/16 0315 01/30/16 0531 01/31/16 0656  NA 139 137 137 139  K 3.9 3.6 3.3* 3.3*  CL 107 110 108 107  CO2 23 22 23 24     GLUCOSE 124* 113* 96 102*  BUN 10 8 6  5*  CALCIUM 9.7 8.5* 8.4* 8.9  CREATININE 0.91 0.74 0.70 0.66  GFRNONAA >60 >60 >60 >60  GFRAA >60 >60 >60 >60    LIVER FUNCTION TESTS:  Recent Labs  01/15/2016 1810  BILITOT 0.5  AST 33  ALT 28  ALKPHOS 81  PROT 7.7  ALBUMIN 4.2    Assessment and Plan:  CVA 6/22 R MCA clot retrieval and IA Tpa Plan per Neuro  Electronically Signed: Danya Spearman A 01/31/2016, 8:51 AM   I spent a total of 15 Minutes at the the patient's bedside AND on the patient's hospital floor or unit, greater than 50% of which was counseling/coordinating care for CVA; R MCA clot retrieval

## 2016-01-31 NOTE — Progress Notes (Signed)
ANTICOAGULATION CONSULT NOTE - Follow Up Consult  Pharmacy Consult for Heparin  Indication: stroke, mechanical AVR  Allergies  Allergen Reactions  . Warfarin Sodium Itching and Other (See Comments)    Cannot take Generic, only brand name  . Hydrocodone Itching    Patient Measurements: Height: 5\' 3"  (160 cm) Weight: 223 lb 15.8 oz (101.6 kg) IBW/kg (Calculated) : 52.4  Vital Signs: Temp: 99.5 F (37.5 C) (06/25 1550) Temp Source: Oral (06/25 1550) BP: 122/55 mmHg (06/25 1532) Pulse Rate: 75 (06/25 1532)  Labs:  Recent Labs  2016-05-03 1810 01/29/16 0315 01/30/16 0531 01/30/16 0532  01/30/16 1954 01/31/16 0656 01/31/16 1608  HGB 12.7 10.4*  --  9.5*  --   --  9.9*  --   HCT 41.2 34.3*  --  32.3*  --   --  33.0*  --   PLT 290 289  --  256  --   --  292  --   APTT 29  --   --   --   --   --   --   --   LABPROT 18.6*  --   --   --   --   --   --   --   INR 1.55*  --   --   --   --   --   --   --   HEPARINUNFRC  --   --   --  <0.10*  < > 0.95* <0.10* 0.15*  CREATININE 0.91 0.74 0.70  --   --   --  0.66  --   TROPONINI 0.05*  --   --   --   --   --   --   --   < > = values in this interval not displayed.  Estimated Creatinine Clearance: 107.5 mL/min (by C-G formula based on Cr of 0.66).   Assessment: 40 y/o F s/p tPA/IR for stroke on 6/22, on heparin drip given high risk with mechanical valve, neurology aware of MRI/CT results with small SAH.  Heparin level remains SUBtherapeutic after most rate increase to 1100 units/hr. No issues with infusion per RN and reports lab drawn correctly.   Goal of Therapy:  Heparin level 0.3-0.5 units/ml Monitor platelets by anticoagulation protocol: Yes   Plan:  -Increase heparin to 1300 units/hr -8hr HL -Daily HL, CBC -Monitor s/sx bleeding   Pollyann SamplesAndy Chanel Mckesson, PharmD, BCPS 01/31/2016, 5:59 PM Pager: [phone]

## 2016-01-31 NOTE — Progress Notes (Signed)
Pt with tongue midline with equal bilateral movement.

## 2016-02-01 ENCOUNTER — Inpatient Hospital Stay (HOSPITAL_COMMUNITY): Payer: 59

## 2016-02-01 DIAGNOSIS — I6930 Unspecified sequelae of cerebral infarction: Secondary | ICD-10-CM | POA: Insufficient documentation

## 2016-02-01 DIAGNOSIS — E876 Hypokalemia: Secondary | ICD-10-CM

## 2016-02-01 DIAGNOSIS — D62 Acute posthemorrhagic anemia: Secondary | ICD-10-CM | POA: Insufficient documentation

## 2016-02-01 DIAGNOSIS — I69391 Dysphagia following cerebral infarction: Secondary | ICD-10-CM | POA: Insufficient documentation

## 2016-02-01 DIAGNOSIS — G8190 Hemiplegia, unspecified affecting unspecified side: Secondary | ICD-10-CM

## 2016-02-01 DIAGNOSIS — Z954 Presence of other heart-valve replacement: Secondary | ICD-10-CM

## 2016-02-01 DIAGNOSIS — I63239 Cerebral infarction due to unspecified occlusion or stenosis of unspecified carotid arteries: Secondary | ICD-10-CM

## 2016-02-01 DIAGNOSIS — D72829 Elevated white blood cell count, unspecified: Secondary | ICD-10-CM

## 2016-02-01 DIAGNOSIS — I6320 Cerebral infarction due to unspecified occlusion or stenosis of unspecified precerebral arteries: Secondary | ICD-10-CM

## 2016-02-01 DIAGNOSIS — I6300 Cerebral infarction due to thrombosis of unspecified precerebral artery: Secondary | ICD-10-CM

## 2016-02-01 DIAGNOSIS — R0682 Tachypnea, not elsewhere classified: Secondary | ICD-10-CM | POA: Insufficient documentation

## 2016-02-01 DIAGNOSIS — I6350 Cerebral infarction due to unspecified occlusion or stenosis of unspecified cerebral artery: Secondary | ICD-10-CM

## 2016-02-01 DIAGNOSIS — I6990 Unspecified sequelae of unspecified cerebrovascular disease: Secondary | ICD-10-CM

## 2016-02-01 DIAGNOSIS — Z952 Presence of prosthetic heart valve: Secondary | ICD-10-CM | POA: Insufficient documentation

## 2016-02-01 DIAGNOSIS — I6340 Cerebral infarction due to embolism of unspecified cerebral artery: Secondary | ICD-10-CM

## 2016-02-01 DIAGNOSIS — G8194 Hemiplegia, unspecified affecting left nondominant side: Secondary | ICD-10-CM | POA: Insufficient documentation

## 2016-02-01 DIAGNOSIS — I63511 Cerebral infarction due to unspecified occlusion or stenosis of right middle cerebral artery: Principal | ICD-10-CM

## 2016-02-01 DIAGNOSIS — I63031 Cerebral infarction due to thrombosis of right carotid artery: Secondary | ICD-10-CM | POA: Insufficient documentation

## 2016-02-01 LAB — CBC
HCT: 32.7 % — ABNORMAL LOW (ref 36.0–46.0)
Hemoglobin: 9.9 g/dL — ABNORMAL LOW (ref 12.0–15.0)
MCH: 23.9 pg — ABNORMAL LOW (ref 26.0–34.0)
MCHC: 30.3 g/dL (ref 30.0–36.0)
MCV: 78.8 fL (ref 78.0–100.0)
Platelets: 329 K/uL (ref 150–400)
RBC: 4.15 MIL/uL (ref 3.87–5.11)
RDW: 16.6 % — ABNORMAL HIGH (ref 11.5–15.5)
WBC: 13 K/uL — ABNORMAL HIGH (ref 4.0–10.5)

## 2016-02-01 LAB — HEPARIN LEVEL (UNFRACTIONATED)
Heparin Unfractionated: 0.34 IU/mL (ref 0.30–0.70)
Heparin Unfractionated: <0.1 IU/mL — ABNORMAL LOW (ref 0.30–0.70)
Heparin Unfractionated: 0.26 [IU]/mL — ABNORMAL LOW (ref 0.30–0.70)

## 2016-02-01 LAB — BASIC METABOLIC PANEL WITH GFR
Anion gap: 7 (ref 5–15)
BUN: 8 mg/dL (ref 6–20)
CO2: 22 mmol/L (ref 22–32)
Calcium: 8.8 mg/dL — ABNORMAL LOW (ref 8.9–10.3)
Chloride: 108 mmol/L (ref 101–111)
Creatinine, Ser: 0.7 mg/dL (ref 0.44–1.00)
GFR calc Af Amer: >60 mL/min
GFR calc non Af Amer: >60 mL/min
Glucose, Bld: 94 mg/dL (ref 65–99)
Potassium: 3.4 mmol/L — ABNORMAL LOW (ref 3.5–5.1)
Sodium: 137 mmol/L (ref 135–145)

## 2016-02-01 MED ORDER — WARFARIN - PHARMACIST DOSING INPATIENT: Freq: Every day | Status: DC

## 2016-02-01 MED ORDER — RESOURCE THICKENUP CLEAR PO POWD
ORAL | Status: DC | PRN
  Filled 2016-02-01: qty 125

## 2016-02-01 MED ORDER — WARFARIN SODIUM 5 MG PO TABS
5.0000 mg | ORAL_TABLET | Freq: Once | ORAL | Status: AC
  Administered 2016-02-01: 5 mg via ORAL
  Filled 2016-02-01: qty 1

## 2016-02-01 MED ORDER — STARCH (THICKENING) PO POWD
ORAL | Status: DC | PRN
  Filled 2016-02-01: qty 227

## 2016-02-01 NOTE — Progress Notes (Signed)
ANTICOAGULATION CONSULT NOTE - Follow Up Consult  Pharmacy Consult for heparin Indication: stroke / mechanical AVR  Allergies  Allergen Reactions  . Warfarin Sodium Itching and Other (See Comments)    Cannot take Generic, only brand name  . Hydrocodone Itching    Patient Measurements: Height: 5\' 3"  (160 cm) Weight: 219 lb 1.6 oz (99.383 kg) IBW/kg (Calculated) : 52.4 Heparin Dosing Weight: 75.7 kg  Vital Signs: Temp: 98.8 F (37.1 C) (06/26 2132) Temp Source: Oral (06/26 1841) BP: 118/56 mmHg (06/26 2132) Pulse Rate: 62 (06/26 2132)  Labs:  Recent Labs  01/30/16 0531  01/30/16 0532  01/31/16 0656  02/01/16 0211 02/01/16 1202 02/01/16 1957  HGB  --   < > 9.5*  --  9.9*  --  9.9*  --   --   HCT  --   --  32.3*  --  33.0*  --  32.7*  --   --   PLT  --   --  256  --  292  --  329  --   --   HEPARINUNFRC  --   --  <0.10*  < > <0.10*  < > 0.34 <0.10* 0.26*  CREATININE 0.70  --   --   --  0.66  --  0.70  --   --   < > = values in this interval not displayed.  Estimated Creatinine Clearance: 106.1 mL/min (by C-G formula based on Cr of 0.7).   Medications:  Scheduled:  . antiseptic oral rinse  7 mL Mouth Rinse q12n4p  . chlorhexidine  15 mL Mouth Rinse BID  . famotidine (PEPCID) IV  20 mg Intravenous Q12H  . Warfarin - Pharmacist Dosing Inpatient   Does not apply q1800   Infusions:  . sodium chloride 125 mL/hr at 02/01/16 1849  . heparin 1,300 Units/hr (02/01/16 0700)    Assessment: 40 yo female with stroke and mechanical AVR is currently on subtherapeutic heparin.  Heparin level is 0.26.  No problem with infusion per RN  Goal of Therapy:  Heparin level 0.3-0.5 units/ml Monitor platelets by anticoagulation protocol: Yes   Plan:  - increase heparin to 1400 units/hr - 6hr heparin level  Nilam Quakenbush, Tsz-Yin 02/01/2016,9:48 PM

## 2016-02-01 NOTE — Progress Notes (Signed)
ANTICOAGULATION CONSULT NOTE - Follow Up Consult  Pharmacy Consult for Heparin  Indication: stroke, mechanical AVR  Allergies  Allergen Reactions  . Warfarin Sodium Itching and Other (See Comments)    Cannot take Generic, only brand name  . Hydrocodone Itching    Patient Measurements: Height: 5\' 3"  (160 cm) Weight: 223 lb 15.8 oz (101.6 kg) IBW/kg (Calculated) : 52.4  Vital Signs: Temp: 99.6 F (37.6 C) (06/25 2309) Temp Source: Oral (06/25 2309) BP: 142/72 mmHg (06/25 2310) Pulse Rate: 67 (06/25 2310)  Labs:  Recent Labs  01/30/16 0531 01/30/16 0532  01/31/16 0656 01/31/16 1608 02/01/16 0211  HGB  --  9.5*  --  9.9*  --  9.9*  HCT  --  32.3*  --  33.0*  --  32.7*  PLT  --  256  --  292  --  329  HEPARINUNFRC  --  <0.10*  < > <0.10* 0.15* 0.34  CREATININE 0.70  --   --  0.66  --  0.70  < > = values in this interval not displayed.  Estimated Creatinine Clearance: 107.5 mL/min (by C-G formula based on Cr of 0.7).   Assessment: 40 y/o F s/p tPA/IR for stroke on 6/22, on heparin drip given high risk with mechanical valve, neurology aware of MRI/CT results with small SAH (stable). HL is therapeutic this AM.   Goal of Therapy:  Heparin level 0.3-0.5 units/ml Monitor platelets by anticoagulation protocol: Yes   Plan:  -Cont heparin at 1300 units/hr -1100 HL  Abran DukeLedford, Collen Hostler 02/01/2016,3:02 AM

## 2016-02-01 NOTE — Progress Notes (Signed)
MBSS complete. Full report located under chart review in imaging section.  Recommend: Dysphagia 1 diet, nectar-thick liquids by cup (no straws), meds crushed in puree, FULL supervision- cue small bites/ sips, minimize distractions in room, encourage pt to put bolus on R side of tongue, check for L side pocketing.  Amy Oleksiak,Thana Ates MA, CCC-SLP [phone]

## 2016-02-01 NOTE — Progress Notes (Signed)
PT Cancellation Note  Patient Details Name: Nichole Black MRN: [id number] DOB: 14-Apr-1976   Cancelled Treatment:    Reason Eval/Treat Not Completed: Patient at procedure or test/unavailable, patient going OTF for procedure.   Fabio AsaWerner, Homer Miller J 02/01/2016, 10:16 AM Charlotte Crumbevon Yania Bogie, PT DPT  [phone]

## 2016-02-01 NOTE — Progress Notes (Signed)
NURSING PROGRESS NOTE  Nichole AcresKelly Black [id number] Transfer Data: 02/01/2016 6:21 PM Attending Provider: Micki RileyPramod S Sethi, MD PCP:No primary care provider on file. Code Status: FUll   Nichole Black is a [age] y.o. female patient [place] [address]  -No acute distress noted.  -No complaints of shortness of breath.  -No complaints of chest pain.   Cardiac Monitoring: Box # 9 in place.   Blood pressure 129/68, pulse 66, temperature 98.9 F (37.2 C), temperature source Oral, resp. rate 14, height 5\' 3"  (1.6 m), weight 101.6 kg (223 lb 15.8 oz), last menstrual period 12/28/2015, SpO2 100 %.   IV Fluids:  IV in place, [facility]. Allergies:  Warfarin sodium and Hydrocodone  Past Medical History:   has a past medical history of S/P aortic valve replacement with bioprosthetic valve and Stroke (HCC).  Past Surgical History:   has past surgical history that includes Carbomedic top hat supra-annular aortic valve; Radiology with anesthesia (N/A, 10/08/2014); and Radiology with anesthesia (N/A, 01/31/2016).  Social History:   reports that she has never smoked. She does not have any smokeless tobacco history on file. She reports that she does not drink alcohol or use illicit drugs.  Skin: Intact  Patient/Family orientated to room. Information packet given to patient/family. Admission inpatient armband information verified with patient/family to include name and date of birth and placed on patient arm. Side rails up x 2, fall assessment and education completed with patient/family. Patient/family able to verbalize understanding of risk associated with falls and verbalized understanding to call for assistance before getting out of bed. Call light within reach. Patient/family able to voice and demonstrate understanding of unit orientation instructions.    Will continue to evaluate and treat per MD orders.

## 2016-02-01 NOTE — Progress Notes (Signed)
Physical Therapy Treatment Patient Details Name: Nichole AcresKelly Black MRN: [id number] DOB: 21-Feb-1976 Today's Date: 02/01/2016    History of Present Illness Patient is a 40 y/o female admitted with L side weakness and fall positive for Acute nonhemorrhagic right MCA infarct involving the basal ganglia and s/p TPA and revascularization and Small volume subarachnoid hemorrhage in the right sylvian fissure.  PMH positive for mechanical heart valve and previous stroke on coumadin.     PT Comments    Patient seen for progression of activity. Tolerated OOB to chair with 2 persons assist. Patient remains limited in strength and motor function as well as overall cognition. Patient with deficits in attention to task and neglect to left side. Multi modal cues to facilitate functional mobility. +2 pivotal transfer to chair. Continue to feel CIR is most appropriate disposition. Will follow and progress as tolerated.   Follow Up Recommendations  CIR     Equipment Recommendations  Other (comment) (TBA)    Recommendations for Other Services Rehab consult;OT consult     Precautions / Restrictions Precautions Precautions: Fall Precaution Comments: dysphagia/dysarthria Restrictions Weight Bearing Restrictions: No    Mobility  Bed Mobility Overal bed mobility: Needs Assistance Bed Mobility: Supine to Sit;Sit to Sidelying     Supine to sit: Mod assist   Sit to sidelying: Mod assist General bed mobility comments: assist for left leg off bed and to lift trunk some for initiation and some due to weakness; sit to side cues and assist for legs into bed  Transfers Overall transfer level: Needs assistance Equipment used: 2 person hand held assist Transfers: Sit to/from Stand;Stand Pivot Transfers Sit to Stand: Mod assist;+2 physical assistance;+2 safety/equipment;From elevated surface Stand pivot transfers: Mod assist;+2 physical assistance;+2 safety/equipment;From elevated surface       General transfer  comment: up from EOB with cues and lifting help; did side step to [facility] mod support difficulty wtih L side awareness to step up to [facility]  Ambulation/Gait                 Stairs            Wheelchair Mobility    Modified Rankin (Stroke Patients Only) Modified Rankin (Stroke Patients Only) Pre-Morbid Rankin Score: No significant disability Modified Rankin: Moderately severe disability     Balance   Sitting-balance support: Feet supported Sitting balance-Leahy Scale: Fair Sitting balance - Comments: toelrate EOB sitting activity with min guard for stability      Standing balance-Leahy Scale: Poor Standing balance comment: able to maintain LLE extension in static standing, but during weight shift required tactile cue from therapist to re-engage during buckling                    Cognition Arousal/Alertness: Awake/alert (initially lethargic but aroused with initial stimulation) Behavior During Therapy: Flat affect Overall Cognitive Status: Impaired/Different from baseline Area of Impairment: Attention;Memory;Following commands;Safety/judgement;Awareness;Problem solving   Current Attention Level: Focused Memory: Decreased short-term memory Following Commands: Follows one step commands with increased time Safety/Judgement: Decreased awareness of safety;Decreased awareness of deficits Awareness: Intellectual Problem Solving: Slow processing;Decreased initiation;Difficulty sequencing;Requires verbal cues;Requires tactile cues General Comments: Patient with noted left side neglect/inattention but does respond to cues toward left side. Cues to direct to task throughout session. Poor initiation of movements requiring increased tactile and verbal cues.    Exercises Other Exercises Other Exercises: functional activities perform in bed as well as EOB Other Exercises: dynamic trunk control activities during functional tasks at EOB Other Exercises: general  LE ROM performed  in conjunction with therapeutic tasks    General Comments        Pertinent Vitals/Pain Pain Assessment: Faces Pain Score: 0-No pain    Home Living Family/patient expects to be discharged to:: Private residence Living Arrangements: Spouse/significant other;Children Available Help at Discharge: Family;Available 24 hours/day Type of Home: House Home Access: Stairs to enter Entrance Stairs-Rails: Right Home Layout: Two level;Able to live on main level with bedroom/bathroom        Prior Function            PT Goals (current goals can now be found in the care plan section) Acute Rehab PT Goals Patient Stated Goal: to go home PT Goal Formulation: With patient Time For Goal Achievement: 02/13/16 Potential to Achieve Goals: Good Progress towards PT goals: Progressing toward goals    Frequency  Min 4X/week    PT Plan Current plan remains appropriate    Co-evaluation PT/OT/SLP Co-Evaluation/Treatment: Yes Reason for Co-Treatment: Complexity of the patient's impairments (multi-system involvement);Necessary to address cognition/behavior during functional activity;For patient/therapist safety PT goals addressed during session: Mobility/safety with mobility       End of Session Equipment Utilized During Treatment: Gait belt Activity Tolerance: Patient tolerated treatment well Patient left: in chair;with call bell/phone within reach     Time: 1437-1502 PT Time Calculation (min) (ACUTE ONLY): 25 min  Charges:  $Therapeutic Activity: 8-22 mins                    G CodesFabio Asa:      Domenique Quest J 02/01/2016, 4:33 PM Charlotte Crumbevon Merrie Epler, PT DPT  [phone]

## 2016-02-01 NOTE — Progress Notes (Signed)
STROKE TEAM PROGRESS NOTE   SUBJECTIVE (INTERVAL HISTORY) There is no family at bedside. Patient now on step down. Ok to transfer to floor. Pt stable over night. Mother  and husband arrived during rounds.     OBJECTIVE Temp:  [98.1 F (36.7 C)-100 F (37.8 C)] 98.1 F (36.7 C) (06/26 0800) Pulse Rate:  [61-75] 61 (06/26 0800) Cardiac Rhythm:  [-] Normal sinus rhythm (06/26 0800) Resp:  [19-23] 19 (06/26 0800) BP: (119-142)/(55-72) 131/65 mmHg (06/26 0800) SpO2:  [97 %-100 %] 99 % (06/26 0800)  CBC:   Recent Labs Lab 04/18/2016 1810 01/29/16 0315  01/31/16 0656 02/01/16 0211  WBC 19.9* 13.7*  < > 11.6* 13.0*  NEUTROABS 17.5* 11.8*  --   --   --   HGB 12.7 10.4*  < > 9.9* 9.9*  HCT 41.2 34.3*  < > 33.0* 32.7*  MCV 78.5 78.0  < > 79.1 78.8  PLT 290 289  < > 292 329  < > = values in this interval not displayed.  Basic Metabolic Panel:   Recent Labs Lab 01/31/16 0656 02/01/16 0211  NA 139 137  K 3.3* 3.4*  CL 107 108  CO2 24 22  GLUCOSE 102* 94  BUN 5* 8  CREATININE 0.66 0.70  CALCIUM 8.9 8.8*      IMAGING  No results found.     PHYSICAL EXAM General - Well nourished, well developed, intubated and sedated.  Ophthalmologic - Fundi not visualized due to small pupils.  Cardiovascular - Regular rate and rhythm.+SEM.  Neuro -  .She is drowsy but opens eye to voice, nods to questions, follows commands. She is oriented to self and month and year.mild dysarthria. No aphasia Left facial droop. Left arm antigravity with drift. She is able to follow simple commands on the right. On the left leg and left arm 3-/5,  And LLE 4/5 strength. PERRL, eye middle position, positive gag or cough, + corneal, blinks to threat bilaterally.  . Toes equivocal, DTR 1+. Sensation, coordination and gait not tested.    ASSESSMENT/PLAN [name] is a 40 y.o. female with history of mechanical heart valve and previous stroke on coumadin presenting with new onset left sided weakness.  She received IV t-PA Jan 02, 2016 at 1834. CTA showed a R MCA M1 occlusion. She was taken to IR where she had TICI3 revascularization of her occluded R MCA with 2 passes of the Solitaire and 3mg  IA tPA. Sheath removed Friday 01/29/2016.  Stroke:  Non-dominant right MCA infarct s/p IV tPA followed by TICI 3 revascularization with mechnical thrombectomy and IA tPA, with asymptomatic post tPA hemorrhagic transformation, SAH. Infarct embolic secondary to cardioembolic source from mechanical heart valve on coumadin but subtherapeutic lNR.   CTA head and neck R MCA M1 occlusion. L ICA terminus ?occlusion since 10/09/14 with faint collaterals L MCA.   Post IR CT head mild R MCA hyperdensity most likely due to contrast retention   MRI / MRA Acute nonhemorrhagic right MCA infarct involving the basal ganglia. Small volume subarachnoid hemorrhage in the right sylvian fissure.  Ct Head Wo Contrast  01/30/2016 Mildly increased volume of right sylvian fissure subarachnoid hemorrhage.  2D Echo - EF 65-70%. Mechanical aortic valve unchanged.  LDL 107  HgbA1c 5.1  IV heparin  for VTE prophylaxis. D/c SCDs Diet NPO time specified  warfarin daily prior to admission, now on heparin IV. IV heparin per pharmacy protocol started 01/29/2016 per Dr. Amada JupiterKirkpatrick for mechanical valve. While it is risky, will  start coumadin with heparin bridge for optimal anticoagulation. Dr. Pearlean BrownieSethi discussed with pt and mother.   Ongoing aggressive stroke risk factor management  Therapy recommendations:  CIR recommended, consulted this am  Disposition:  pending  (married, lives w/ husband who works 3rd shift and has 4 boys)  Hx of stroke 10/2014  L MCA d/t L ICA occlusion from cardiogenic embolism from mechanical heart valve with suboptimal anticoagulation INR 1.4  S/p IR with TICI 3 left ICA distal and left MCA  CUS left ICA 40-69% stenosis   Put on ASA along with coumadin as well as pravastatin  Minimal residue on the  right  Followed by Dr. Pearlean BrownieSethi in the clinic  Left ICA terminus ? High grade stenosis / occlusion  CTA head suggesting left ICA terminus occlusion  Need cerebral angio in the future to evaluate ICA terminus status May consider left ICA stenting if still high grade stenosis. However, pt will need plavix along with coumadin INR 2.5-3.5. May be too risky for bleeding side effects.  Respiratory Failure  Resolved Now Extubated   Dysphagia   Secondary to stroke  ST to assess.  Prosthetic Aortic Mechanical Heart Valve  Used POC testing/INR adjustement at home, [facility]  Husband reports she may not be compliant, that is her choice. He does not monitor  Pt was told by cardiology that no more heart surgery due to scar tissue  Hypertension  SBP goal 120-140  On cardene drip initially, now off   SBP 120 - 160 due to left ICA stenosis / occlusion ??   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 33 stable. Remains stable Monday at 9.9  Leukocytosis - 12.8 -> 13.0 - fever 101.4 -> 99.5 Sunday, 98.1 Monday. CXR and U/A unremarkable.   Hypokalemia - 3.4 (NPO) monitor  Hospital day # 4  Rhoderick MoodyBIBY,SHARON  [facility] See Amion for Pager information 02/01/2016 9:54 AM  I have personally examined this patient, reviewed notes,  independently viewed imaging studies, participated in medical decision making and plan of care. I have made any additions or clarifications directly to the above note. Agree with note above. Patient presents a high risk situation with medical decision making of high complexity. She is at risk for recurrent strokes due to mechanical heart valve as well as high-grade recurrent stenosis however anticoagulation is risky in the setting of her acute stroke with subarachnoid hemorrhage. I had a long discussion with the patient, mother and husband and the bedside regarding her risky situation and recommend resuming warfarin if she can swallow safely since it's been 6 days since her stroke. Continue IV heparin bridging till warfarin is therapeutic or she is discharged home which ever is earlier. Greater than 50% time during this 35 minute visit was spent on counseling and coordination of care about her stroke risk,  risk of intracerebral hemorrhage and answering questions   Delia Heady, MD Medical Director [facility] Pager: [phone] 02/01/2016 3:43 PM   To contact Stroke Continuity provider, please refer to [email or web address]. After hours, contact General Neurology

## 2016-02-01 NOTE — Care Management Note (Signed)
Case Management Note  Patient Details  Name: Nichole Black MRN: [id number] Date of Birth: [birth year]  Subjective/Objective:    right MCA infarct s/p IV tPA followed by TICI 3 revascularization                 Action/Plan: Discharge Planning:  NCM spoke to pt and lives at home with husband, Leandra KernJoseph Brunton # [phone]. Has four boys at home. Plan is for IP rehab. Will continue to follow for dc needs.    Expected Discharge Date:                  Expected Discharge Plan:  IP Rehab Facility  In-House Referral:  NA  Discharge planning Services  CM Consult  Post Acute Care Choice:  NA Choice offered to:  NA  DME Arranged:  N/A DME Agency:  NA  HH Arranged:  NA HH Agency:  NA  Status of Service:  In process, will continue to follow  If discussed at Long Length of Stay Meetings, dates discussed:    Additional Comments:  Elliot CousinShavis, Caylea Foronda Ellen, RN 02/01/2016, 4:00 PM

## 2016-02-01 NOTE — Consult Note (Signed)
Physical Medicine and Rehabilitation Consult Reason for Consult: Nonhemorrhagic right MCA infarct Referring Physician: Dr. Pearlean BrownieSethi   HPI: Nichole AcresKelly Black is a 40 y.o. 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 History taken from chart review. Per chart review patient lives with spouse and family. Independent prior to admission. 2 level home with bedroom downstairs. 8 steps to entry. Presented 01-18-2016 with acute onset of left-sided weakness with slurred speech . By report she was having an argument with her son. INR on admission of 1.55. Urine drug screen negative. CT of the head showed mild increased density over the right MCA and mild hyperdensity over the gyri of the right MCA territory. MRI/MRA of the brain showed acute nonhemorrhagic right MCA infarct affecting the basal ganglia. Small volume subarachnoid hemorrhage in the right sylvian fissure. 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 Left ICA terminus and left MCA occlusion unchanged from recent CTA. Patient did receive TPA. Echocardiogram with ejection fraction of 70% no wall motion abnormalities. Interventional radiology consulted and underwent revascularization of occluded  MCA. Patient presently on intravenous heparin with Coumadin ongoing. Formal swallow study pending. Physical therapy evaluation completed with recommendations of physical medicine rehabilitation consult   Review of Systems  Unable to perform ROS: mental acuity   Past Medical History  Diagnosis Date  . S/P aortic valve replacement with bioprosthetic valve   . Stroke [facility](HCC)    Past Surgical History  Procedure Laterality Date  . Carbomedic top hat supra-annular aortic valve    . Radiology with anesthesia N/A 10/08/2014    Procedure: RADIOLOGY WITH ANESTHESIA;  Surgeon: Oneal GroutSanjeev K Deveshwar, MD;  Location: [facility] OR;  Service: Radiology;  Laterality: N/A;  . Radiology with anesthesia  N/A 2015-12-01    Procedure: RADIOLOGY WITH ANESTHESIA;  Surgeon: Julieanne CottonSanjeev Deveshwar, MD;  Location: [facility] OR;  Service: Radiology;  Laterality: N/A;   No family history on file. Social History:  reports that she has never smoked. She does not have any smokeless tobacco history on file. She reports that she does not drink alcohol or use illicit drugs. Allergies:  Allergies  Allergen Reactions  . Warfarin Sodium Itching and Other (See Comments)    Cannot take Generic, only brand name  . Hydrocodone Itching   Medications Prior to Admission  Medication Sig Dispense Refill  . amitriptyline (ELAVIL) 25 MG tablet Take 1 tablet (25 mg total) by mouth at bedtime. Reported on 08/27/2015 30 tablet 11  . topiramate (TOPAMAX) 25 MG tablet Take 1 tablet (25 mg total) by mouth 2 (two) times daily. (Patient taking differently: Take 25 mg by mouth 2 (two) times daily as needed. ) 120 tablet 3  . warfarin (COUMADIN) 4 MG tablet daily    . pravastatin (PRAVACHOL) 20 MG tablet Take 1 tablet (20 mg total) by mouth daily at 6 PM. (Patient not taking: Reported on 08/27/2015) 30 tablet 3    Home: Home Living Family/patient expects to be discharged to:: Private residence Living Arrangements: Spouse/significant other, Children Available Help at Discharge: Family, Available 24 hours/day Type of Home: House Home Access: Stairs to enter Secretary/administratorntrance Stairs-Number of Steps: 8 Entrance Stairs-Rails: Right Home Layout: Two level, Able to live on main level with bedroom/bathroom Alternate Level Stairs-Number of Steps: flight  Functional History: Prior Function Level of Independence: Independent Functional Status:  Mobility: Bed Mobility Overal bed mobility: Needs Assistance Bed Mobility: Supine to Sit, Sit to Sidelying Supine to sit: Mod  assist Sit to sidelying: Mod assist General bed mobility comments: assist for left leg off bed and to lift trunk some for initiation and some due to weakness; sit to side cues and  assist for legs into bed Transfers Overall transfer level: Needs assistance Transfers: Sit to/from Stand Sit to Stand: Mod assist General transfer comment: up from EOB with cues and lifting help; [facility] difficulty wtih L side awareness to [facility]      ADL:    Cognition: Cognition Overall Cognitive Status: Difficult to assess Orientation Level: Oriented to person, Oriented to place, Oriented to time Cognition Arousal/Alertness: Lethargic Behavior During Therapy: [facility] Care[facility]FL for tasks assessed/performed Overall Cognitive Status: Difficult to assess Difficult to assess due to: Level of arousal, Impaired communication  Blood pressure 131/65, pulse 61, temperature 98.1 F (36.7 C), temperature source Oral, resp. rate 19, height 5\' 3"  (1.6 m), weight 101.6 kg (223 lb 15.8 oz), last menstrual period 12/28/2015, SpO2 99 %. Physical Exam  Vitals reviewed. Constitutional: She appears well-developed and well-nourished.  HENT:  Head: Normocephalic and atraumatic.  Eyes: Right eye exhibits no discharge. Left eye exhibits no discharge.  Pupils reactive to light  Neck: Normal range of motion. Neck supple. No thyromegaly present.  Cardiovascular: Normal rate and regular rhythm.   Respiratory: Effort normal and breath sounds normal.  + [place]  GI: Soft. Bowel sounds are normal. She exhibits no distension.  Musculoskeletal: She exhibits no edema or tenderness.  PROM within normal limits  Neurological:  Patient is lethargic and only briefly opens eyes and may follow 1 command before falling back asleep.  Appears to have a right gaze preference.  Unable to assess sensation DTRs symmetric Motor: Limited assessment, however appears to be moving right upper extremity and right lower extremity appropriately. Is also moving left lower extremity, but less than right side. Not moving left upper extremity.  Skin: Skin is warm and dry.  Psychiatric:  Unable to assess due to  mentation    Results for orders placed or performed during the hospital encounter of 05-16-2016 (from the past 24 hour(s))  Heparin level (unfractionated)     Status: Abnormal   Collection Time: 01/31/16  4:08 PM  Result Value Ref Range   Heparin Unfractionated 0.15 (L) 0.30 - 0.70 IU/mL  Heparin level (unfractionated)     Status: None   Collection Time: 02/01/16  2:11 AM  Result Value Ref Range   Heparin Unfractionated 0.34 0.30 - 0.70 IU/mL  Basic metabolic panel     Status: Abnormal   Collection Time: 02/01/16  2:11 AM  Result Value Ref Range   Sodium 137 135 - 145 mmol/L   Potassium 3.4 (L) 3.5 - 5.1 mmol/L   Chloride 108 101 - 111 mmol/L   CO2 22 22 - 32 mmol/L   Glucose, Bld 94 65 - 99 mg/dL   BUN 8 6 - 20 mg/dL   Creatinine, Ser 1.610.70 0.44 - 1.00 mg/dL   Calcium 8.8 (L) 8.9 - 10.3 mg/dL   GFR calc non Af Amer >60 >60 mL/min   GFR calc Af Amer >60 >60 mL/min   Anion gap 7 5 - 15  CBC     Status: Abnormal   Collection Time: 02/01/16  2:11 AM  Result Value Ref Range   WBC 13.0 (H) 4.0 - 10.5 K/uL   RBC 4.15 3.87 - 5.11 MIL/uL   Hemoglobin 9.9 (L) 12.0 - 15.0 g/dL   HCT 09.632.7 (L)  36.0 - 46.0 %   MCV 78.8 78.0 - 100.0 fL   MCH 23.9 (L) 26.0 - 34.0 pg   MCHC 30.3 30.0 - 36.0 g/dL   RDW 16.1 (H) 09.6 - 04.5 %   Platelets 329 150 - 400 K/uL   Ct Head Wo Contrast  01/30/2016  CLINICAL DATA:  Subarachnoid hemorrhage. Acute right MCA infarct status post tPA and endovascular therapy. EXAM: CT HEAD WITHOUT CONTRAST TECHNIQUE: Contiguous axial images were obtained from the base of the skull through the vertex without intravenous contrast. COMPARISON:  01/29/2016 FINDINGS: Subarachnoid hemorrhage in the right sylvian fissure has mildly increased in volume compared to yesterday's study. There may be a punctate focus of subarachnoid blood in the left sylvian fissure. Cytotoxic edema in the right basal ganglia from known infarct is unchanged. There is no evidence of new infarct, midline  shift, or extra-axial fluid collection. 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 Mass effect on the frontal horn of the right lateral ventricle is unchanged. Orbits are unremarkable. Visualized paranasal sinuses and mastoid air cells are clear. IMPRESSION: 1. Mildly increased volume of right sylvian fissure subarachnoid hemorrhage. 2. Unchanged acute right basal ganglia infarct. Electronically Signed   By: Sebastian Ache M.D.   On: 01/30/2016 16:23   Dg Chest Port 1 View  01/31/2016  CLINICAL DATA:  Acute respiratory failure EXAM: PORTABLE CHEST 1 VIEW COMPARISON:  01/30/2016 FINDINGS: Endotracheal tube has been removed. Cardiomediastinal silhouette is stable. Status post median sternotomy. Mild elevation of the right hemidiaphragm again noted. No pneumothorax. No infiltrate or pulmonary edema. Mild basilar atelectasis. Status post aortic valve replacement. IMPRESSION: Endotracheal tube has been removed. No pneumothorax. No infiltrate or pulmonary edema. Status post median sternotomy and aortic valve replacement. Mild basilar atelectasis. Electronically Signed   By: Natasha Mead M.D.   On: 01/31/2016 09:52    Assessment/Plan: Diagnosis: Nonhemorrhagic right MCA infarct Labs and images independently reviewed.  Records reviewed and summated above. Stroke: Continue secondary stroke prophylaxis and Risk Factor Modification listed below:   Antiplatelet therapy:   Blood Pressure Management:  Continue current medication with prn's with permisive HTN per primary team Statin Agent:   ?Left sided hemiparesis: fit for orthosis to prevent contractures (resting hand splint for day, wrist cock up splint at night, PRAFO, etc) Motor recovery: Fluoxetine  1. Does the need for close, 24 hr/day medical supervision in concert with the patient's rehab needs make it unreasonable for this patient to be served in a less intensive setting? Yes   2. Co-Morbidities requiring supervision/potential complications: aortic valve replacement/CVA with little residual weakness (cont meds), poststroke dysphagia (Cont SLP, advance diet as tolerated), tachypnea (monitor RR and O2 Sats with increased physical exertion), hypokalemia (continue to monitor and replete as necessary), leukocytosis (cont to monitor for signs and symptoms of infection, further workup if indicated), ABLA (transfuse if necessary to ensure appropriate perfusion for increased activity tolerance) 3. Due to safety, skin/wound care, disease management, medication administration and patient education, does the patient require 24 hr/day rehab nursing? Yes 4. Does the patient require coordinated care of a physician, rehab nurse, PT (1-2 hrs/day, 5 days/week), OT (1-2 hrs/day, 5 days/week) and SLP (1-2 hrs/day, 5 days/week) to address physical and functional deficits in the context of the above medical diagnosis(es)? Yes Addressing deficits in the following areas: balance, endurance, locomotion, strength, transferring, bowel/bladder control, bathing, dressing, toileting, speech, swallowing and psychosocial support 5. Can the patient actively participate  in an intensive therapy program of at least 3 hrs of therapy per day at least 5 days per week? Potentially 6. The potential for patient to make measurable gains while on inpatient rehab is excellent 7. Anticipated functional outcomes upon discharge from inpatient rehab are supervision and min assist  with PT, supervision and min assist with OT, modified independent and supervision with SLP. 8. Estimated rehab length of stay to reach the above functional goals is: 16-19 days. 9. Does the patient have adequate social supports and living environment to accommodate these discharge functional goals? Potentially 10. Anticipated D/C setting: Home 11. Anticipated post D/C treatments: HH therapy and Home excercise program 12. Overall Rehab/Functional  Prognosis: good  RECOMMENDATIONS: This patient's condition is appropriate for continued rehabilitative care in the following setting: Likely CIR when patient demonstrates ability to tolerate 3 hours/day. Will need to clarify caregiver support at discharge. Patient has agreed to participate in recommended program. Potentially Note that insurance prior authorization may be required for reimbursement for recommended care.  Comment: Rehab Admissions Coordinator to follow up.  Maryla Morrow, MD 02/01/2016

## 2016-02-01 NOTE — Progress Notes (Signed)
I will follow up with pt and family to discuss a possible inpt rehab admission pending insurance approval and bed availability when pt medically ready to d/c. [phone]

## 2016-02-01 NOTE — Progress Notes (Signed)
ANTICOAGULATION CONSULT NOTE - Follow Up Consult  Pharmacy Consult for Heparin and warfarin Indication: stroke, mechanical AVR  Allergies  Allergen Reactions  . Warfarin Sodium Itching and Other (See Comments)    Cannot take Generic, only brand name  . Hydrocodone Itching    Patient Measurements: Height: 5\' 3"  (160 cm) Weight: 223 lb 15.8 oz (101.6 kg) IBW/kg (Calculated) : 52.4  Vital Signs: Temp: 98.6 F (37 C) (06/26 1100) Temp Source: Oral (06/26 1100) BP: 135/62 mmHg (06/26 1100) Pulse Rate: 65 (06/26 1100)  Labs:  Recent Labs  01/30/16 0531  01/30/16 0532  01/31/16 0656 01/31/16 1608 02/01/16 0211 02/01/16 1202  HGB  --   < > 9.5*  --  9.9*  --  9.9*  --   HCT  --   --  32.3*  --  33.0*  --  32.7*  --   PLT  --   --  256  --  292  --  329  --   HEPARINUNFRC  --   --  <0.10*  < > <0.10* 0.15* 0.34 <0.10*  CREATININE 0.70  --   --   --  0.66  --  0.70  --   < > = values in this interval not displayed.  Estimated Creatinine Clearance: 107.5 mL/min (by C-G formula based on Cr of 0.7).   Assessment: 40 y/o F s/p tPA/IR for stroke on 6/22, on heparin drip given high risk with mechanical valve. Neurology aware of MRI/CT results with small SAH (stable).   Hgb 9.9, Plt 329, both stable with no bleeding noted.   HL was therapeutic x1 on 1300 units/hr, repeat check is now undetectable. Pt's nurse reports that the heparin was turned off for at least 1 hr while the patient was downstairs for a modified barium swallow study.  Pt is also to resume warfarin today. PTA pt was taking 4 mg daily and per Care Everywhere records she has been in the 2.2-2.4 range on that dose with a few readings of 3.1 and 3.3.   Goal of Therapy:  INR 2-3 Heparin level 0.3-0.5 units/ml Monitor platelets by anticoagulation protocol: Yes   Plan:  --restart heparin at 1300 units/hr --HL in 6 hr --warfarin 5 mg po x1 tonight --Daily HL, INR & CBC  Arcola JanskyMeagan Grantley Savage, PharmD Clinical Pharmacy  Resident Pager: [phone] 02/01/2016,1:50 PM

## 2016-02-01 NOTE — Progress Notes (Signed)
Occupational Therapy Evaluation Patient Details Name: Stephenie AcresKelly Benedetti MRN: [id number] DOB: [birth year] Today's Date: 02/01/2016    History of Present Illness Patient is a 40 y/o female admitted with L side weakness and fall positive for Acute nonhemorrhagic right MCA infarct involving the basal ganglia and s/p TPA and revascularization and Small volume subarachnoid hemorrhage in the right sylvian fissure.  PMH positive for mechanical heart valve and previous stroke on coumadin.    Clinical Impression   PTA, pt independent with ADL and mobility. Pt currently requries Mod A +2 with stand pivot transfers and Max A with LB ADL. Pt presents with significant deficits as listed below and will benefit from rehab at CIR to maximize her functional level of independence. Will follow acutely to address established goals.     Follow Up Recommendations  CIR;Supervision/Assistance - 24 hour    Equipment Recommendations  3 in 1 bedside comode    Recommendations for Other Services Rehab consult     Precautions / Restrictions Precautions Precautions: Fall Precaution Comments: dysphagia/dysarthria Restrictions Weight Bearing Restrictions: No      Mobility Bed Mobility Overal bed mobility: Needs Assistance Bed Mobility: Supine to Sit;Sit to Sidelying     Supine to sit: Mod assist   Sit to sidelying: Mod assist General bed mobility comments: assist for left leg off bed and to lift trunk some for initiation and some due to weakness; sit to side cues and assist for legs into bed  Transfers Overall transfer level: Needs assistance Equipment used: 2 person hand held assist Transfers: Sit to/from Stand;Stand Pivot Transfers Sit to Stand: Mod assist;+2 physical assistance;+2 safety/equipment;From elevated surface Stand pivot transfers: Mod assist;+2 physical assistance;+2 safety/equipment;From elevated surface       General transfer comment: up from EOB with cues and lifting help; did side step to  [facility] mod support difficulty wtih L side awareness to [facility]    Balance   Sitting-balance support: Feet supported Sitting balance-Leahy Scale: Fair Sitting balance - Comments: toelrate EOB sitting activity with min guard for stability      Standing balance-Leahy Scale: Poor Standing balance comment: able to maintain LLE extension in static standing, but during weight shift required tactile cue from therapist to re-engage during buckling                            ADL Overall ADL's : Needs assistance/impaired Eating/Feeding: Supervision/ safety;Moderate assistance (nectar thick liquids; puree)   Grooming: Moderate assistance   Upper Body Bathing: Moderate assistance;Sitting   Lower Body Bathing: Moderate assistance   Upper Body Dressing : Moderate assistance;Sitting   Lower Body Dressing: Moderate assistance;Bed level   Toilet Transfer: +2 for physical assistance;Moderate assistance;Stand-pivot (simulated)   Toileting- Clothing Manipulation and Hygiene: Total assistance Toileting - Clothing Manipulation Details (indicate cue type and reason): incontinenet     Functional mobility during ADLs: Moderate assistance;+2 for physical assistance (stand pivot only) General ADL Comments: Cues throughout tasks to attend to L side     Vision Vision Assessment?: Vision impaired- to be further tested in functional context Additional Comments: poor visual attention to L will further assess   Perception Perception Perception Tested?: Yes Perception Deficits: Inattention/neglect Spatial deficits: Will further assess   Praxis Praxis Praxis tested?: Deficits Deficits: Initiation;Motor Impersistence Praxis-Other Comments: will further assess    Pertinent Vitals/Pain Pain Assessment: Faces Pain Score: 0-No pain     Hand Dominance Right   Extremity/Trunk Assessment Upper Extremity Assessment  Upper Extremity Assessment: LUE deficits/detail LUE Deficits / Details:  Attempts to move spontaneously in isolated movemetn patterns. Greater movement proximally. Movement improves as attention is increased to L side.. With elbow supported, pt able to complete hand to mouth with min A. Requires vc to maintain attention to task to compelte movemetn. Able to demonstrate gross grasp/release. Apparent motor impersistence. LUE Sensation: decreased light touch;decreased proprioception LUE Coordination: decreased fine motor;decreased gross motor   Lower Extremity Assessment Lower Extremity Assessment: Defer to PT evaluation   Cervical / Trunk Assessment Cervical / Trunk Assessment: Other exceptions (R bias)   Communication Communication Communication: Expressive difficulties   Cognition Arousal/Alertness: Awake/alert (initially lethargic but aroused with initial stimulation) Behavior During Therapy: Flat affect Overall Cognitive Status: Impaired/Different from baseline Area of Impairment: Attention;Memory;Following commands;Safety/judgement;Awareness;Problem solving   Current Attention Level: Focused Memory: Decreased short-term memory Following Commands: Follows one step commands with increased time Safety/Judgement: Decreased awareness of safety;Decreased awareness of deficits Awareness: Intellectual Problem Solving: Slow processing;Decreased initiation;Difficulty sequencing;Requires verbal cues;Requires tactile cues General Comments: Apparent L inattention/negaect. R gaze preference but will turn to locate stimulus in L field. Difficulty maintaining gaze in L field. Attempts to use LUE at times. Extinction noted on L with B double stimulation   General Comments       Exercises       Shoulder Instructions      Home Living Family/patient expects to be discharged to:: Private residence Living Arrangements: Spouse/significant other;Children Available Help at Discharge: Family;Available 24 hours/day Type of Home: House Home Access: Stairs to enter ITT IndustriesEntrance  Stairs-Number of Steps: 8 Entrance Stairs-Rails: Right Home Layout: Two level;Able to live on main level with bedroom/bathroom Alternate Level Stairs-Number of Steps: flight       Bathroom Toilet: Standard         Additional Comments: unsure - pt unable to give information      Prior Functioning/Environment Level of Independence: Independent        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    OT Diagnosis: Generalized weakness;Cognitive deficits;Disturbance of vision;Hemiplegia non-dominant side   OT Problem List: Decreased strength;Decreased range of motion;Decreased activity tolerance;Impaired balance (sitting and/or standing);Impaired vision/perception;Decreased coordination;Decreased cognition;Decreased safety awareness;Decreased knowledge of use of DME or AE;Decreased knowledge of precautions;Cardiopulmonary status limiting activity;Impaired sensation;Impaired tone;Obesity;Impaired UE functional use;Increased edema   OT Treatment/Interventions: Self-care/ADL training;Therapeutic exercise;Neuromuscular education;DME and/or AE instruction;Therapeutic activities;Patient/family education;Visual/perceptual remediation/compensation;Cognitive remediation/compensation;Balance training    OT Goals(Current goals can be found in the care plan section) Acute Rehab OT Goals Patient Stated Goal: to go home OT Goal Formulation: Patient unable to participate in goal setting Time For Goal Achievement: 02/15/16 Potential to Achieve Goals: Good  OT Frequency: Min 3X/week   Barriers to D/C:            Co-evaluation PT/OT/SLP Co-Evaluation/Treatment: Yes Reason for Co-Treatment: Complexity of the patient's impairments (multi-system involvement);Necessary to address cognition/behavior during functional activity;For patient/therapist safety PT goals addressed during session: Mobility/safety with mobility OT goals addressed during session: ADL's and self-care      End of Session Equipment  Utilized During Treatment: Gait belt Nurse Communication: Mobility status;Need for lift equipment (maximove)  Activity Tolerance: Patient tolerated treatment well Patient left: in chair;with call bell/phone within reach   Time: 1441-1513 OT Time Calculation (min): 32 min Charges:  OT General Charges $OT Visit: 1 Procedure OT Evaluation $OT Eval Moderate Complexity: 1 Procedure G-Codes:    Asyah Candler,HILLARY 02/01/2016, 4:42 PM   [facility] Centerilary Parys Elenbaas, OTR/L  [phone] 02/01/2016

## 2016-02-01 NOTE — Progress Notes (Signed)
Speech Language Pathology Treatment: Dysphagia  Patient Details Name: Nichole Black MRN: [id number] DOB: 7/2[name]6/1977 Today's Date: 02/01/2016 Time: [id number] SLP Time Calculation (min) (ACUTE ONLY): 15 min  Assessment / Plan / Recommendation Clinical Impression  Dysphagia treatment provided for PO trials. Pt tolerated trials of puree and small sips of thin liquid without overt s/s of aspiration. Initially pt had an immediate cough following trial of ice chips, possibly indicating decreased sensation with smaller bolus size. Oral phase was prolonged with trials of puree but no oral residuals noted post-swallow. Occasional mild anterior spillage occurred which pt was aware of. Pt is oriented x4 but taking 4-5 seconds to respond to questions verbally. Given post-intubation/ cognitive status, pt is at an increased risk of aspiration. Recommend continuing NPO status with further recommendations pending objective evaluation; MBS to be completed later this a.m.    HPI HPI: Pt is a 40 y.o. female who had sudden onset L side weakness on 6/22. MRI showed acute nonhemorrhagic R CVA involving basal ganglia, small subarachnoid hemorrhage R sylvian fissure, additional subarachnoid blood products versus artifactual FLAIR signal in bilateral cerebral sulci, stable to minimally increased number of scattered chronic cerebral microhemorrhages. CXR showed persistent atelectasis LLL, no pleural effusion. Pt was intubated 6/22 to 6/24. Pt with hx of stroke 10/2014- L MCA occlusion. Bedside swallow ordered for stroke workup and post-extubation.       SLP Plan  MBS     Recommendations  Diet recommendations: NPO Medication Administration: Via alternative means             General recommendations: Rehab consult Oral Care Recommendations: Oral care QID Follow up Recommendations: Inpatient Rehab Plan: MBS     GO                Nichole KungOleksiak, Nichole K, MA, CCC-SLP 02/01/2016, 8:55 AM [phone]

## 2016-02-01 NOTE — Progress Notes (Signed)
OT Cancellation Note  Patient Details Name: Nichole Black MRN: [id number] DOB: [birth year]   Cancelled Treatment:    Reason Eval/Treat Not Completed: Patient at procedure or test/ unavailable. Having swallowing test. Will return this pm if able.   [facility] LLC[facility],HILLARY  Jordie Skalsky, OTR/L  [phone] 02/01/2016  02/01/2016, 10:21 AM

## 2016-02-02 LAB — HEPARIN LEVEL (UNFRACTIONATED)
HEPARIN UNFRACTIONATED: 0.31 [IU]/mL (ref 0.30–0.70)
HEPARIN UNFRACTIONATED: 0.36 [IU]/mL (ref 0.30–0.70)

## 2016-02-02 LAB — CBC
HEMATOCRIT: 32.6 % — AB (ref 36.0–46.0)
Hemoglobin: 9.9 g/dL — ABNORMAL LOW (ref 12.0–15.0)
MCH: 23.9 pg — AB (ref 26.0–34.0)
MCHC: 30.4 g/dL (ref 30.0–36.0)
MCV: 78.7 fL (ref 78.0–100.0)
Platelets: 341 10*3/uL (ref 150–400)
RBC: 4.14 MIL/uL (ref 3.87–5.11)
RDW: 16.9 % — AB (ref 11.5–15.5)
WBC: 14.6 10*3/uL — AB (ref 4.0–10.5)

## 2016-02-02 LAB — PROTIME-INR
INR: 1.36 (ref 0.00–1.49)
Prothrombin Time: 16.9 seconds — ABNORMAL HIGH (ref 11.6–15.2)

## 2016-02-02 MED ORDER — TOPIRAMATE 25 MG PO TABS
25.0000 mg | ORAL_TABLET | Freq: Two times a day (BID) | ORAL | Status: DC
  Administered 2016-02-02 (×2): 25 mg via ORAL
  Filled 2016-02-02 (×2): qty 1

## 2016-02-02 MED ORDER — AMITRIPTYLINE HCL 50 MG PO TABS
25.0000 mg | ORAL_TABLET | Freq: Every day | ORAL | Status: DC
  Administered 2016-02-02: 25 mg via ORAL
  Filled 2016-02-02: qty 1

## 2016-02-02 MED ORDER — RESOURCE THICKENUP CLEAR PO POWD: ORAL | Status: DC | PRN

## 2016-02-02 MED ORDER — WARFARIN SODIUM 5 MG PO TABS
5.0000 mg | ORAL_TABLET | Freq: Once | ORAL | Status: AC
  Administered 2016-02-02: 5 mg via ORAL
  Filled 2016-02-02: qty 1

## 2016-02-02 NOTE — Discharge Instructions (Signed)

## 2016-02-02 NOTE — Progress Notes (Addendum)
I met with pt and her spouse at bedside. We discussed a possible inpt rehab admission pending insurance approval and bed availability. I will begin insurance authorization and follow up tomorrow.Spouse states his Mom and 40 yo son can assist when he works third shift to provide 24/7 assist at discharge. If bed unavailable at [facility] CIR, Spouse would prefer inpt rehab at [facility] as second option. [phone]

## 2016-02-02 NOTE — Progress Notes (Signed)
STROKE TEAM PROGRESS NOTE   SUBJECTIVE (INTERVAL HISTORY) Patient currently stable. Still drowsy but opens eyes and follows commands. Left-sided weakness persists.   OBJECTIVE Temp:  [98.4 F (36.9 C)-99.4 F (37.4 C)] 99.4 F (37.4 C) (06/27 1027) Pulse Rate:  [62-66] 63 (06/27 1027) Cardiac Rhythm:  [-] Sinus bradycardia (06/27 0713) Resp:  [14-22] 16 (06/27 1027) BP: (118-142)/(55-89) 123/62 mmHg (06/27 1027) SpO2:  [96 %-100 %] 98 % (06/27 1027) Weight:  [99.383 kg (219 lb 1.6 oz)] 99.383 kg (219 lb 1.6 oz) (06/26 1841)  CBC:   Recent Labs Lab 01/30/2016 1810 01/29/16 0315  02/01/16 0211 02/02/16 0519  WBC 19.9* 13.7*  < > 13.0* 14.6*  NEUTROABS 17.5* 11.8*  --   --   --   HGB 12.7 10.4*  < > 9.9* 9.9*  HCT 41.2 34.3*  < > 32.7* 32.6*  MCV 78.5 78.0  < > 78.8 78.7  PLT 290 289  < > 329 341  < > = values in this interval not displayed.  Basic Metabolic Panel:   Recent Labs Lab 01/31/16 0656 02/01/16 0211  NA 139 137  K 3.3* 3.4*  CL 107 108  CO2 24 22  GLUCOSE 102* 94  BUN 5* 8  CREATININE 0.66 0.70  CALCIUM 8.9 8.8*      IMAGING  No results found.     PHYSICAL EXAM General - Well nourished, well developed, intubated and sedated.  Ophthalmologic - Fundi not visualized due to small pupils.  Cardiovascular - Regular rate and rhythm.+SEM.  Neuro -  .She is drowsy but opens eye to voice, nods to questions, follows commands. She is oriented to self and month and year.mild dysarthria. No aphasia Left facial droop. Left arm antigravity with drift. She is able to follow simple commands on the right. On the left leg and left arm 3-/5,  And LLE 4/5 strength. PERRL, eye middle position, positive gag or cough, + corneal, blinks to threat bilaterally.  . Toes equivocal, DTR 1+. Sensation, coordination and gait not tested.    ASSESSMENT/PLAN Nichole Black is a 40 y.o. female with history of mechanical heart valve and previous stroke on coumadin presenting with  new onset left sided weakness. She received IV t-PA 01/14/2016 at 1834. CTA showed a R MCA M1 occlusion. She was taken to IR where she had TICI3 revascularization of her occluded R MCA with 2 passes of the Solitaire and 3mg  IA tPA. Sheath removed Friday 01/29/2016.  Stroke:  Non-dominant right MCA infarct s/p IV tPA followed by TICI 3 revascularization with mechnical thrombectomy and IA tPA, with asymptomatic post tPA hemorrhagic transformation, SAH. Infarct embolic secondary to cardioembolic source from mechanical heart valve on coumadin but subtherapeutic lNR.   CTA head and neck R MCA M1 occlusion. L ICA terminus ?occlusion since 10/09/14 with faint collaterals L MCA.   Post IR CT head mild R MCA hyperdensity most likely due to contrast retention   MRI / MRA Acute nonhemorrhagic right MCA infarct involving the basal ganglia. Small volume subarachnoid hemorrhage in the right sylvian fissure.  Ct Head Wo Contrast  01/30/2016 Mildly increased volume of right sylvian fissure subarachnoid hemorrhage.  2D Echo - EF 65-70%. Mechanical aortic valve unchanged.  LDL 107  HgbA1c 5.1  IV heparin  for VTE prophylaxis. D/c SCDs DIET - DYS 1 Room service appropriate?: Yes; Fluid consistency:: Nectar Thick  warfarin daily prior to admission, now on heparin IV. IV heparin per pharmacy protocol started 01/29/2016 per Dr. Amada JupiterKirkpatrick  for mechanical valve. While it is risky, will start coumadin with heparin bridge for optimal anticoagulation. Dr. Pearlean BrownieSethi discussed with pt and mother.   Ongoing aggressive stroke risk factor management  Therapy recommendations:  CIR recommended, consulted this am  Disposition:  pending  (married, lives w/ husband who works 3rd shift and has 4 boys)  Hx of stroke 10/2014  L MCA d/t L ICA occlusion from cardiogenic embolism from mechanical heart valve with suboptimal anticoagulation INR 1.4  S/p IR with TICI 3 left ICA distal and left MCA  CUS left ICA 40-69% stenosis   Put  on ASA along with coumadin as well as pravastatin  Minimal residue on the right  Followed by Dr. Pearlean BrownieSethi in the clinic  Left ICA terminus ? High grade stenosis / occlusion  CTA head suggesting left ICA terminus occlusion  Need cerebral angio in the future to evaluate ICA terminus status May consider left ICA stenting if still high grade stenosis. However, pt will need plavix along with coumadin INR 2.5-3.5. May be too risky for bleeding side effects.  Respiratory Failure  Resolved Now Extubated   Dysphagia   Secondary to stroke  ST to assess.  Prosthetic Aortic Mechanical Heart Valve  Used POC testing/INR adjustement at home, [facility]  Husband reports she may not be compliant, that is her choice. He does not monitor  Pt was told by cardiology that no more heart surgery due to scar tissue  Hypertension  SBP goal 120-140  On cardene drip initially, now off   SBP 120 - 160 due to left ICA stenosis / occlusion ??   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 33 stable. Remains stable Monday at 9.9  Leukocytosis - 12.8 -> 13.0 - fever 101.4 -> 99.5 Sunday, 98.1 Monday. CXR and U/A unremarkable.   Hypokalemia - 3.4 (NPO) monitor  Hospital day # 5  I have personally examined this patient, reviewed notes, independently viewed imaging  studies, participated in medical decision making and plan of care. I have made any additions or clarifications directly to the above note. Agree with note above. Continue warfarin IV heparin until INR is therapeutic. Mobilize out of bed. Transfer to rehabilitation over the next few days.. Discussed with patient and husband and answered questions. Greater than 50% time during this 25 minute visit was spent on counseling and coordination of care about her stroke risk and prevention  Delia HeadyPramod Alaiyah Bollman, MD Medical Director [facility] Pager: [phone] 02/02/2016 5:35 PM   To contact Stroke Continuity provider, please refer to [email or web address]. After hours, contact General Neurology

## 2016-02-02 NOTE — Clinical Social Work Note (Signed)
Clinical Social Work Assessment  Patient Details  Name: Nichole Black MRN: [id number] Date of Birth: [birth year]  Date of referral:  02/02/16               Reason for consult:  Facility Placement                Permission sought to share information with:  Family Supports Permission granted to share information::  Yes, Verbal Permission Granted  Name::     Programmer, systemsjoseph  Agency::  SNFs  Relationship::  husband  Contact Information:     Housing/Transportation Living arrangements for the past 2 months:  Single Family Home Source of Information:  Patient Patient Interpreter Needed:  None Criminal Activity/Legal Involvement Pertinent to Current Situation/Hospitalization:  No - Comment as needed Significant Relationships:  Spouse Lives with:  Spouse, Minor Children Do you feel safe going back to the place where you live?  No Need for family participation in patient care:  No (Coment)  Care giving concerns:  Pt lives at home with spouse and 4 minor children- would not have sufficient physical support to return home   Social Worker assessment / plan:  CSW spoke with pt about CIR vs SNF when ready for DC- explained differences between programs.  Pt was quiet during interview and when she did speak speech was slurred- responded mainly with head shaking.    Employment status:    Insurance information:  Managed Care PT Recommendations:  Inpatient Rehab Consult Information / Referral to community resources:  Skilled Nursing Facility  Patient/Family's Response to care:  Pt is agreeable to short term SNF stay if CIR unable to admit but would prefer CIR if possible.  Patient/Family's Understanding of and Emotional Response to Diagnosis, Current Treatment, and Prognosis:  Pt has had stroke in the past and is aware of condition- pt did not make consistent eye contact and had depressed demeanor.  Emotional Assessment Appearance:  Appears stated age Attitude/Demeanor/Rapport:    Affect (typically  observed):  Accepting, Appropriate, Quiet Orientation:  Oriented to Situation, Oriented to  Time, Oriented to Place, Oriented to Self Alcohol / Substance use:  Not Applicable Psych involvement (Current and /or in the community):  No (Comment)  Discharge Needs  Concerns to be addressed:  Care Coordination Readmission within the last 30 days:  No Current discharge risk:  Physical Impairment Barriers to Discharge:  Continued Medical Work up   Peabody EnergyHoloman, Winni Ehrhard M, LCSW 02/02/2016, 2:01 PM

## 2016-02-02 NOTE — Progress Notes (Signed)
Nutrition Follow-up  DOCUMENTATION CODES:   Obesity unspecified  INTERVENTION:   -Magic Cup TID with meals  NUTRITION DIAGNOSIS:   Inadequate oral intake related to dysphagia as evidenced by per patient/family report.  Progressing  GOAL:   Patient will meet greater than or equal to 90% of their needs  Progressing  MONITOR:   PO intake, Supplement acceptance, Diet advancement, Labs, Weight trends, Skin, I & O's  REASON FOR ASSESSMENT:   Ventilator    ASSESSMENT:   40 year old female with PMH of AVR and prior stroke admitted 6/22 for R MCA CVA. She received tpa and IR revascularization.   Pt s/p RT common carotid arteriogram,follwed by complete revascularization of occluded RT MCA on 01/30/16.   Rt and lt femoral sheath removed on 01/29/16. MRI revealed mild SAH.   Pt extubated on 01/30/16. Pt underwent MBSS on 02/01/16 and was advanced to a dysphagia 1 diet with nectar thick liquids.   Pt very somnolent at time of visit. Spoke with pt's husband at bedside. Appetite was very good PTA. He reports pt's lethargy is affecting pt's PO intake, but notices improvement today. Spoke with SLP, who informed this RD and pt husband that pt was just advanced to a dysphagia 2 diet with nectar thick liquids. Pt husband is hopeful that intake will improved with diet upgrade. Meal completion 20-80% per doc flowsheets.   CIR admissions team following for potential admission.   Labs reviewed.   Diet Order:  DIET DYS 2 Room service appropriate?: Yes; Fluid consistency:: Nectar Thick  Skin:  Reviewed, no issues  Last BM:  01/27/16  Height:   Ht Readings from Last 1 Encounters:  02/01/16 5\' 3"  (1.6 m)    Weight:   Wt Readings from Last 1 Encounters:  02/01/16 219 lb 1.6 oz (99.383 kg)    Ideal Body Weight:  52.2 kg  BMI:  Body mass index is 38.82 kg/(m^2).  Estimated Nutritional Needs:   Kcal:  1600-1800  Protein:  90-105 grams  Fluid:  1.6-1.8 L  EDUCATION NEEDS:    No education needs identified at this time  Harshini Trent A. Mayford KnifeWilliams, RD, LDN, CDE Pager: [phone] After hours Pager: [phone]

## 2016-02-02 NOTE — Progress Notes (Signed)
ANTICOAGULATION CONSULT NOTE - Follow Up Consult  Pharmacy Consult for Heparin and warfarin Indication: stroke, mechanical AVR  Allergies  Allergen Reactions  . Warfarin Sodium Itching and Other (See Comments)    Cannot take Generic, only brand name  . Hydrocodone Itching    Patient Measurements: Height: 5\' 3"  (160 cm) Weight: 219 lb 1.6 oz (99.383 kg) IBW/kg (Calculated) : 52.4  Vital Signs: Temp: 99.4 F (37.4 C) (06/27 1027) Temp Source: Oral (06/27 1027) BP: 123/62 mmHg (06/27 1027) Pulse Rate: 63 (06/27 1027)  Labs:  Recent Labs  01/31/16 0656  02/01/16 0211  02/01/16 1957 02/02/16 0519 02/02/16 1148  HGB 9.9*  --  9.9*  --   --  9.9*  --   HCT 33.0*  --  32.7*  --   --  32.6*  --   PLT 292  --  329  --   --  341  --   LABPROT  --   --   --   --   --  16.9*  --   INR  --   --   --   --   --  1.36  --   HEPARINUNFRC <0.10*  < > 0.34  < > 0.26* 0.31 0.36  CREATININE 0.66  --  0.70  --   --   --   --   < > = values in this interval not displayed.  Estimated Creatinine Clearance: 106.1 mL/min (by C-G formula based on Cr of 0.7).   Assessment: 40 y/o F s/p tPA/IR for stroke on 6/22, on heparin drip and warfarin given high risk with mechanical valve. Neurology aware of MRI/CT results with small SAH (stable).   HL today is therapeutic at 0.36, INR subtherapeutic at 1.36.  Hgb 9.9, Plt 341, both stable with no bleeding noted.   PTA Warfarin: 4 mg daily  Goal of Therapy:  INR 2-3 Heparin level 0.3-0.5 units/ml Monitor platelets by anticoagulation protocol: Yes   Plan:   --continue heparin at 1400 units/hr  --warfarin 5 mg po x1 tonight --Daily HL, INR & CBC --warfarin re-education  Arcola JanskyMeagan Teniyah Seivert, PharmD Clinical Pharmacy Resident Pager: [phone] 02/02/2016,1:09 PM

## 2016-02-02 NOTE — Progress Notes (Signed)
ANTICOAGULATION CONSULT NOTE - Follow Up Consult  Pharmacy Consult for Heparin  Indication: stroke, mechanical AVR  Allergies  Allergen Reactions  . Warfarin Sodium Itching and Other (See Comments)    Cannot take Generic, only brand name  . Hydrocodone Itching    Patient Measurements: Height: 5\' 3"  (160 cm) Weight: 219 lb 1.6 oz (99.383 kg) IBW/kg (Calculated) : 52.4  Vital Signs: Temp: 98.6 F (37 C) (06/27 0539) Temp Source: Oral (06/27 0201) BP: 140/89 mmHg (06/27 0539) Pulse Rate: 63 (06/27 0539)  Labs:  Recent Labs  01/31/16 0656  02/01/16 0211 02/01/16 1202 02/01/16 1957 02/02/16 0519  HGB 9.9*  --  9.9*  --   --  9.9*  HCT 33.0*  --  32.7*  --   --  32.6*  PLT 292  --  329  --   --  341  LABPROT  --   --   --   --   --  16.9*  INR  --   --   --   --   --  1.36  HEPARINUNFRC <0.10*  < > 0.34 <0.10* 0.26* 0.31  CREATININE 0.66  --  0.70  --   --   --   < > = values in this interval not displayed.  Estimated Creatinine Clearance: 106.1 mL/min (by C-G formula based on Cr of 0.7).   Assessment: 40 y/o F s/p tPA/IR for stroke on 6/22, on heparin drip given high risk with mechanical valve, neurology aware of MRI/CT results with small SAH (stable). HL is therapeutic this AM.   Goal of Therapy:  Heparin level 0.3-0.5 units/ml Monitor platelets by anticoagulation protocol: Yes   Plan:  -Cont heparin at 1400 units/hr -1200 HL  Abran DukeLedford, Tymesha Ditmore 02/02/2016,6:06 AM

## 2016-02-02 NOTE — Progress Notes (Signed)
Speech Language Pathology Treatment: Dysphagia  Patient Details Name: Stephenie [place]Kelly [place] MRN: [id number] DOB: 26-Dec-1975 Today's Date: 02/02/2016 Time: 1100-1140 SLP Time Calculation (min) (ACUTE ONLY): 40 min  Assessment / Plan / Recommendation Clinical Impression  Pt demonstrates ongoing dysphagia, but verbalizes desire to attempt more palatable soft foods. Pt observed to have concern for residuals or late penetration of liquids, possibly falling from oral cavity post swallow. This was noticed by staff at meals as well. Encouraged a throat clear and second swallow which reduced throat clearing. Pt was able to state need for placement of food on the right side of mouth, but needed verbal cues to follow strategy. Regardless, there was no left sided oral residuals with trials today. Recommend pt upgrade to dys 2 (fine chopped) diet with nectar thick liquids. Continue to recommend CIR at d/c.    HPI HPI: Pt is a 40 y.o. female who had sudden onset L side weakness on 6/22. MRI showed acute nonhemorrhagic R CVA involving basal ganglia, small subarachnoid hemorrhage R sylvian fissure, additional subarachnoid blood products versus artifactual FLAIR signal in bilateral cerebral sulci, stable to minimally increased number of scattered chronic cerebral microhemorrhages. CXR showed persistent atelectasis LLL, no pleural effusion. Pt was intubated 6/22 to 6/24. Pt with hx of stroke 10/2014- L MCA occlusion. Bedside swallow ordered for stroke workup and post-extubation.       SLP Plan  Continue with current plan of care     Recommendations  Diet recommendations: Dysphagia 2 (fine chop);Nectar-thick liquid Liquids provided via: Cup Medication Administration: Whole meds with puree Supervision: Staff to assist with self feeding Compensations: Minimize environmental distractions;Slow rate;Small sips/bites;Monitor for anterior loss;Lingual sweep for clearance of pocketing Postural Changes and/or Swallow Maneuvers:  Seated upright 90 degrees             General recommendations: Rehab consult Oral Care Recommendations: Oral care BID Follow up Recommendations: Inpatient Rehab Plan: Continue with current plan of care     GO               [facility] Center LLCBonnie Berel Najjar, MA CCC-SLP [phone]  Claudine MoutonDeBlois, Rosamaria Donn Caroline 02/02/2016, 11:55 AM

## 2016-02-02 NOTE — [place] FL2 (Signed)
[place] MEDICAID FL2 LEVEL OF CARE SCREENING TOOL     IDENTIFICATION  Patient Name: Nichole AcresKelly Ashbaugh Birthdate: 1976-02-17 Sex: female Admission Date (Current Location): 01/19/2016  [facility] [place]   Facility and Address:  [facility], [address], [place], [place] [address]      Provider Number: [id number]  Attending Physician Name and Address:  Micki RileyPramod S Enrico Eaddy, MD  Relative Name and Phone Number:       Current Level of Care: Hospital Recommended Level of Care: Skilled Nursing Facility Prior Approval Number:    Date Approved/Denied:   PASRR Number: [id number]  Discharge Plan: SNF    Current Diagnoses: Patient Active Problem List   Diagnosis Date Noted  . Cerebrovascular accident (CVA) due to thrombosis of precerebral artery (HCC)   . Cerebrovascular accident (CVA) due to thrombosis of right carotid artery (HCC)   . Status post aortic valve replacement   . History of CVA with residual deficit   . Tachypnea   . Hypokalemia   . Leukocytosis   . Acute blood loss anemia   . Left hemiparesis (HCC)   . Dysphagia, post-stroke   . Acute right MCA stroke (HCC)   . Acute hypoxemic respiratory failure (HCC)   . Acute postoperative respiratory failure (HCC) 10/09/2014  . Cerebral infarction due to embolism of left middle cerebral artery (HCC)   . Mechanical heart valve present   . Chronic anticoagulation   . Hyperlipidemia   . Morbid obesity (HCC)   . Stroke (HCC) 10/08/2014    Orientation RESPIRATION BLADDER Height & Weight     Self, Time, Situation, Place  Normal Incontinent Weight: 99.383 kg (219 lb 1.6 oz) Height:  5\' 3"  (160 cm)  BEHAVIORAL SYMPTOMS/MOOD NEUROLOGICAL BOWEL NUTRITION STATUS      Continent Diet (see DC summary)  AMBULATORY STATUS COMMUNICATION OF NEEDS Skin   Extensive Assist Verbally Normal                       Personal Care Assistance Level of Assistance  Bathing, Dressing Bathing  Assistance: Maximum assistance   Dressing Assistance: Maximum assistance     Functional Limitations Info             SPECIAL CARE FACTORS FREQUENCY  PT (By licensed PT), OT (By licensed OT)     PT Frequency: 5/day OT Frequency: 5/day            Contractures      Additional Factors Info  Code Status, Allergies Code Status Info: FULL Allergies Info: Warfarin Sodium, Hydrocodone           Current Medications (02/02/2016):  This is the current hospital active medication list Current Facility-Administered Medications  Medication Dose Route Frequency Provider Last Rate Last Dose  . 0.9 %  sodium chloride infusion   Intravenous Continuous Rejeana BrockMcNeill P Kirkpatrick, MD 125 mL/hr at 02/01/16 1849    . antiseptic oral rinse (CPC / CETYLPYRIDINIUM CHLORIDE 0.05%) solution 7 mL  7 mL Mouth Rinse q12n4p Marvel PlanJindong Xu, MD   7 mL at 02/01/16 1600  . chlorhexidine (PERIDEX) 0.12 % solution 15 mL  15 mL Mouth Rinse BID Marvel PlanJindong Xu, MD   15 mL at 02/01/16 2237  . famotidine (PEPCID) IVPB 20 mg premix  20 mg Intravenous Q12H Rejeana BrockMcNeill P Kirkpatrick, MD   20 mg at 02/01/16 2237  . heparin ADULT infusion 100 units/mL (25000 units/23950mL sodium chloride 0.45%)  1,400 Units/hr Intravenous Continuous  Micki RileyPramod S Kida Digiulio, MD 14 mL/hr at 02/01/16 2237 1,400 Units/hr at 02/01/16 2237  . hydrALAZINE (APRESOLINE) injection 10-20 mg  10-20 mg Intravenous Q4H PRN Duayne CalPaul W Hoffman, NP   20 mg at 10-06-2015 2327  . RESOURCE THICKENUP CLEAR   Oral PRN Micki RileyPramod S Jonessa Triplett, MD      . Warfarin - Pharmacist Dosing Inpatient   Does not apply [id number] Micki RileyPramod S Arrayah Connors, MD         Discharge Medications: Please see discharge summary for a list of discharge medications.  Relevant Imaging Results:  Relevant Lab Results:   Additional Information SS#: [id number]  Izora RibasHoloman, Jenna M, [place]  I have personally examined this patient, reviewed notes,  participated in medical decision making and plan of care. I have made any additions or  clarifications directly to the above note. Agree with note above.    Delia HeadyPramod Kaysey Berndt, MD Medical Director [facility] Pager: [phone] 02/02/2016 6:10 PM

## 2016-02-03 LAB — HEPARIN LEVEL (UNFRACTIONATED): HEPARIN UNFRACTIONATED: 0.42 [IU]/mL (ref 0.30–0.70)

## 2016-02-03 MED ORDER — DEXTROSE 5 % IV SOLN
0.5000 ug/min | INTRAVENOUS | Status: DC
  Filled 2016-02-03: qty 4

## 2016-02-05 MED FILL — Medication: Qty: 1 | Status: AC
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
## 2016-02-06 NOTE — Procedures (Signed)
Intubation Procedure Note Nichole AcresKelly Black [id number] 19-Dec-1975  Procedure: Intubation Indications: Respiratory insufficiency  Procedure Details Consent: Unable to obtain consent because of emergent medical necessity. Time Out: Verified patient identification, verified procedure, site/side was marked, verified correct patient position, special equipment/implants available, medications/allergies/relevent history reviewed, required imaging and test results available.  Performed  Maximum sterile technique was used including gloves.  MAC and 4    Evaluation Hemodynamic Status: coding; O2 sats: coding Patient's Current Condition: unstable Complications: Complications of cardiac death Patient did tolerate procedure well. Chest X-ray ordered to verify placement.  CXR: pending.   Pt intubated during CPR. Confirmed by ETCO2, bilateral breath sounds and condensation in ETT. Pt left intubated after time of death called.   Gaetano HawthorneBrooker, Nichole Black 2016-02-08

## 2016-02-06 NOTE — Progress Notes (Signed)
RN notified by CCMD while in 5w16 that patient in 5w04 was in "perfect V-fib". As RN got off phone with CCMD, NT Kiana called RN and stated patient did not look right. At that time, RN and NT met up at [place] door and quickly ran to 5w04 to assess patient. As RN and NT Lauralyn Primes reached 5w04 lab tech in room told RN to call code blue. NT Kiana pulled code and RN went to patient. Patient was gurgling, drooling, and unresponsive. RN lowered head of bed and immediately began compressions. [place] staff arrived with crash cart promptly at [place] pads applied and back board placed. Code team arrived and ran code. Family notified and on-call neurology paged. Code ended at [phone]. Family arrived around 0707, mother of patient requested endotracheal tube be removed. Educated mother on need for endotracheal tube for autopsy due to medical examiner cases. Patient family refused autopsy and requested endotracheal tube be removed. Family at bedside with chaplin and rapid response. Paged attending and made aware of situation.

## 2016-02-06 NOTE — Progress Notes (Signed)
The body was transported to the morgue. Bed placement notified.

## 2016-02-06 NOTE — Code Documentation (Signed)
  Patient Name: Nichole Black   MRN: [id number]   Date of Birth/ Sex: 28-Aug-1975 , female      Admission Date: 10/01/15  Attending Provider: Micki RileyPramod S Sethi, MD  Primary Diagnosis: Stroke [facility](HCC) [I63.9] Stroke (cerebrum) [facility](HCC) [I63.9] Cerebral infarction due to embolism of left middle cerebral artery [facility](HCC) [I63.412]   Indication: Pt was in her usual state of health until this AM, when she was noted to be in V-fib. Code blue was subsequently called. At the time of arrival on scene, ACLS protocol was underway.    Technical Description:  - CPR performance duration:  48  minutes  - Was defibrillation or cardioversion used? Yes   - Was external pacer placed? No  - Was patient intubated pre/post CPR? Yes   Medications Administered: Y = Yes; Blank = No Amiodarone  Y  Atropine    Calcium  Y  Epinephrine  Y  Lidocaine    Magnesium    Norepinephrine    Phenylephrine    Sodium bicarbonate  Y  Vasopressin    Other    Post CPR evaluation:  - Final Status - Was patient successfully resuscitated ? No   Miscellaneous Information:  - Time of death:  6:47  AM  - Primary team notified?  Yes  - Family Notified? Yes     John GiovanniVasundhra Jamarques Pinedo, MD   01/19/2016, 6:51 AM

## 2016-02-06 NOTE — Progress Notes (Signed)
   01/28/2016 0700  Clinical Encounter Type  Visited With Family;Patient not available  Visit Type Follow-up;Spiritual support;Death  Referral From Nurse  Consult/Referral To Chaplain  Spiritual Encounters  Spiritual Needs Prayer;Grief support  Stress Factors  Patient Stress Factors Not reviewed  Family Stress Factors Family relationships;Health changes;Loss of control   Chaplain responded to 5W04 for an unexpected death of a patient.  The family was present and very distraught.  Chaplain prayed for patient and family.  Chaplain needs to follow up with family as they are in complete shock and despair currently.  Rosezella FloridaLisa M [facility] Center[facility] 01/22/2016 7:55 AM

## 2016-02-06 DEATH — deceased

## 2016-08-25 ENCOUNTER — Ambulatory Visit: Payer: 59 | Admitting: Nurse Practitioner

## 2017-08-02 IMAGING — CT CT ANGIO NECK
1 of 9 series · 1 of 33 positions shown · IV contrast (Iohexol (Omnipaque 350))
Comparison: Head CT without contrast 8261 hours today, 10/09/2014
brain MRI and intracranial MRA.

CLINICAL DATA: 39-year-old female with mechanical heart valve. Code
stroke with left side weakness. Hyperdense right MCA on noncontrast
head CT. Left MCA infarcts demonstrated by MRI in 9550. Initial
encounter.

EXAM:
CT ANGIOGRAPHY HEAD AND NECK
TECHNIQUE: Multidetector CT imaging of the head and neck was performed using
the standard protocol during bolus administration of intravenous
contrast. Multiplanar CT image reconstructions and MIPs were
obtained to evaluate the vascular anatomy. Carotid stenosis
measurements (when applicable) are obtained utilizing NASCET
criteria, using the distal internal carotid diameter as the
denominator.
CONTRAST:  50 mL Asovue-QYY

[Series 200: locator · axial · 0.49mm/px · 1 of 1 slices shown]
[im 1/1  soft-tissue]
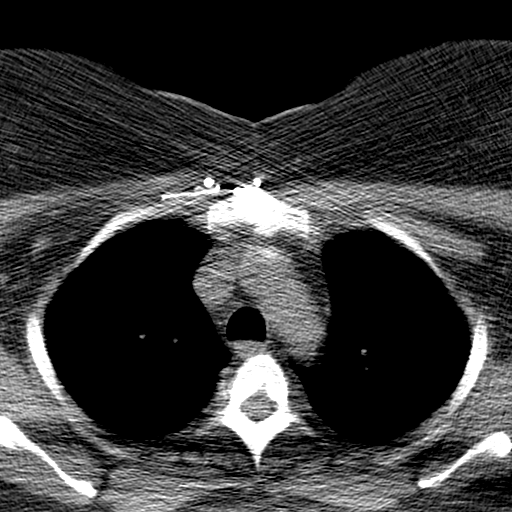

[1 of 33 positions shown; findings below may reference images not displayed]

FINDINGS: CT HEAD

Reported separately.

CTA NECK

Skeleton: Partially visible sequelae of median sternotomy. No acute
osseous abnormality identified.

Other neck: Negative lung apices. No superior mediastinal
lymphadenopathy.

Thyroid, larynx, pharynx, parapharyngeal spaces, retropharyngeal
space, sublingual space, submandibular glands, parotid glands, and
cervical lymph nodes are within normal limits.

Aortic arch: 3 vessel arch configuration. No arch atherosclerosis or
great vessel origin stenosis.

Right carotid system: Mild motion artifact in the lower neck.
Negative right CCA. Negative right carotid bifurcation. Negative
cervical right ICA.

Left carotid system: Mild motion artifact at the level of the lower
left CCA which appears to remain normal. Left carotid bifurcation
appears widely patent, but distal to the bulb the cervical left ICA
is diminutive and remain so to the level of the skullbase. Left ICA
caliber is about half that on the right throughout the neck.

Vertebral arteries:No proximal subclavian artery stenosis. Normal
vertebral artery origins, the right vertebral artery origin is
somewhat early. Both vertebral arteries are patent to the skullbase
without stenosis.

CTA HEAD

Posterior circulation: Codominant distal vertebral arteries are
normal to the vertebrobasilar junction. Normal right PICA origin.
Normal AICA origins. No basilar stenosis. SCA and PCA origins are
normal. Diminutive posterior communicating arteries. Bilateral PCA
branches are within normal limits.

Anterior circulation: Diminutive left ICA siphon remains patent to
the anterior genu, but the left ICA terminus appears functionally
occluded. There are small left lenticulostriate and left MCA M2
region collaterals, but with little overall left MCA region
enhancement. The left ACA A1 segment is chronically diminutive or
absent.

The right ICA siphon is patent to the right ICA terminus. The right
ACA origin is normal. The left ACA is chronically or congenitally
supplied from the right side. There is mild motion artifact along
the proximal ACA is which appear patent. The distal ACA branches
appear within normal limits.

Right MCA origin is patent but the right M1 is occluded 11 mm beyond
its origin (see series 407, image 45). There is some right M2
collateral enhancement in the sylvian fissure.

Anatomic variants: Diminutive or absent left ACA A1 segment such
that both ACA is are supplied from the right.
IMPRESSION: 1. Positive for emergent large vessel occlusion: Right MCA M1
segment. Some collateral enhancement visible in the right sylvian
fissure.
This was discussed by telephone with Dr. OMOHA TANIMOLA on
01/28/2016 At 6917 hours.
2. Occlusion of the left ICA terminus since the MRA on 10/09/2014.
Faint collateral enhancement in the left lenticulostriate and left
MCA territory.
3. Normal appearing bilateral ACAs, the left is supplied from the
right due to diminutive or absent left A1.
4. No atherosclerosis or stenosis in the neck. Negative posterior
circulation.

## 2017-08-02 IMAGING — CT CT HEAD W/O CM
3 of 4 series · 17 of 47 positions shown, 20 images · non-contrast
Comparison: CTA head and neck from today reported separately.

ADDENDUM:
With regard to impression # 4, the following correction is noted:
The chronic appearing bilateral ACA territory infarcts have occurred
since October 2014. (not October 2015)
CLINICAL DATA: 39-year-old female code stroke. Left side weakness
last seen normal at 0044 hours. Initial encounter.

EXAM:
CT HEAD WITHOUT CONTRAST
TECHNIQUE: Contiguous axial images were obtained from the base of the skull
through the vertex without intravenous contrast.

[Series 201: head w/o, idose (1) · axial · non-contrast · 0.49mm/px · z∈[+60,+180]mm · 11 of 29 slices shown, 14 images]
[im 3/29  brain]
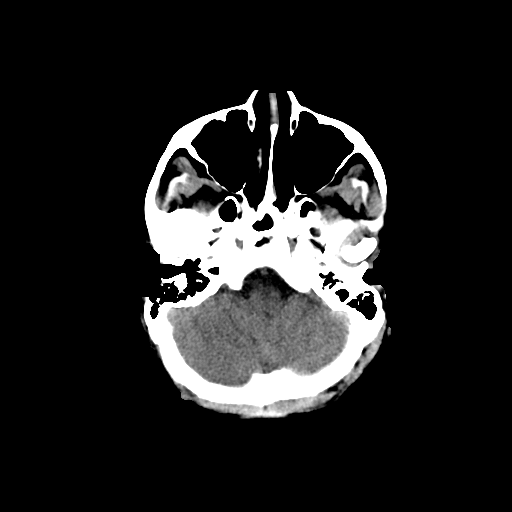
[im 3/29  bone]
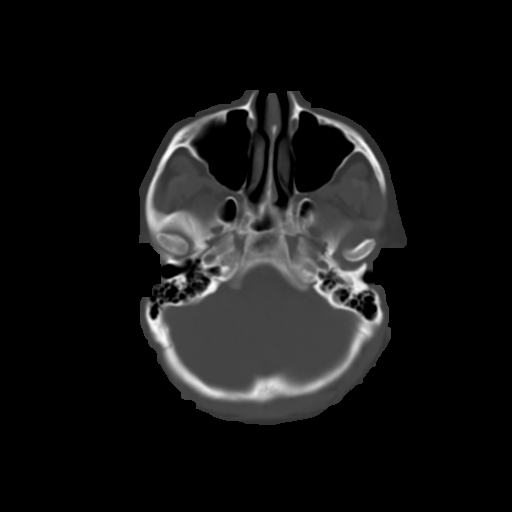
[im 5/29  brain]
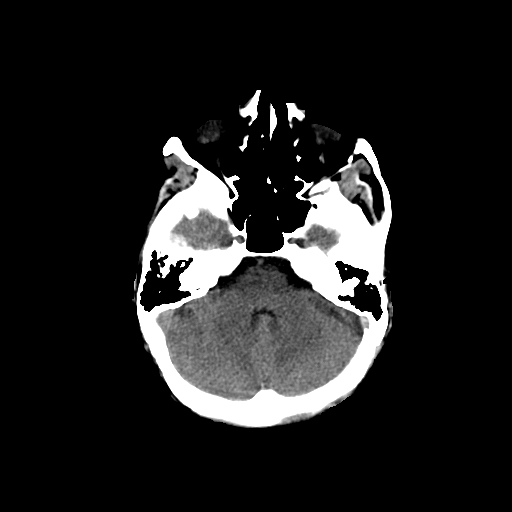
[im 7/29  brain]
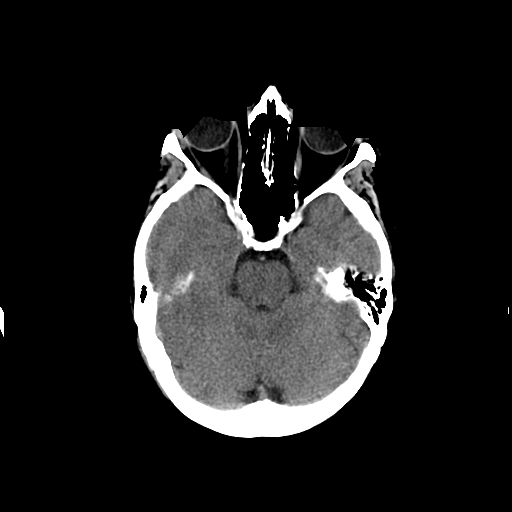
[im 11/29  brain]
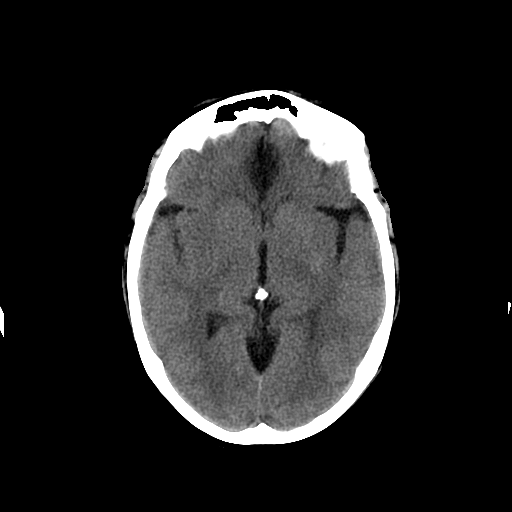
[im 13/29  brain]
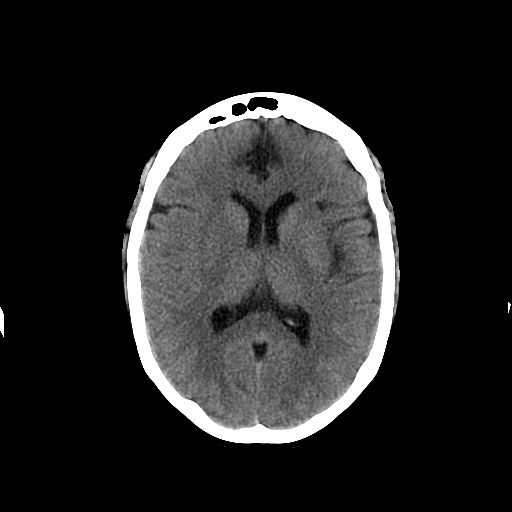
[im 13/29  bone]
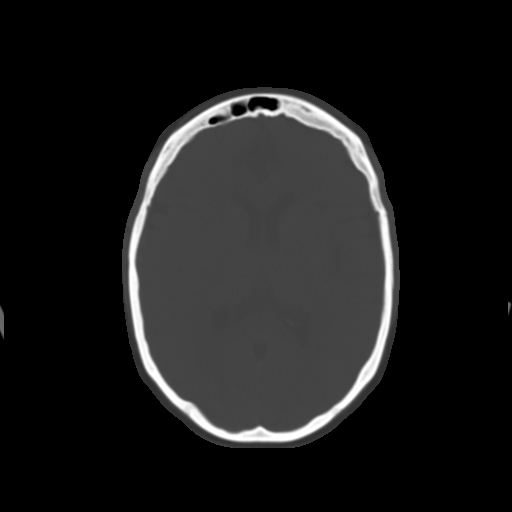
[im 15/29  brain]
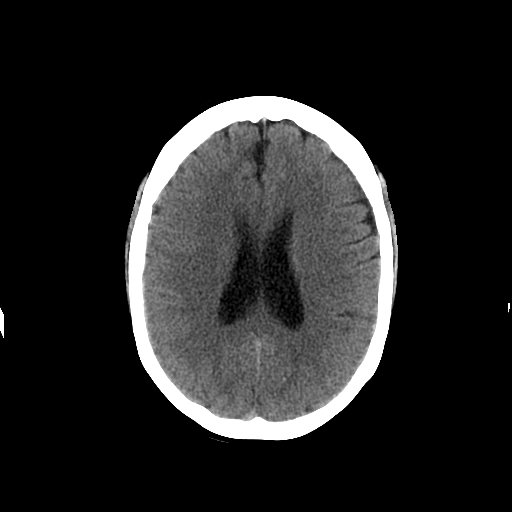
[im 17/29  brain]
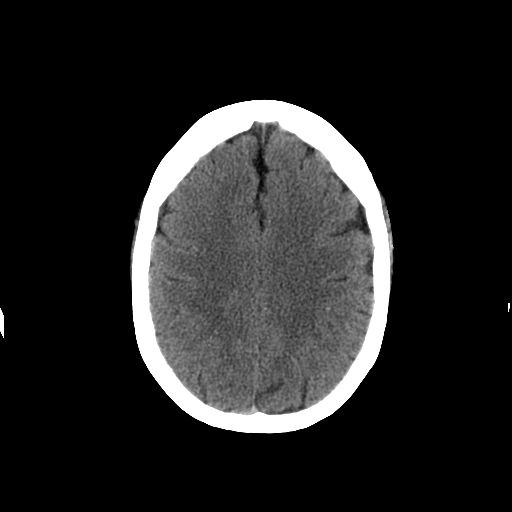
[im 19/29  brain]
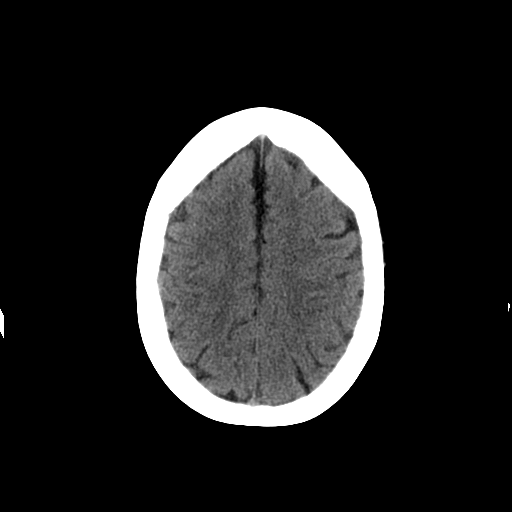
[im 23/29  brain]
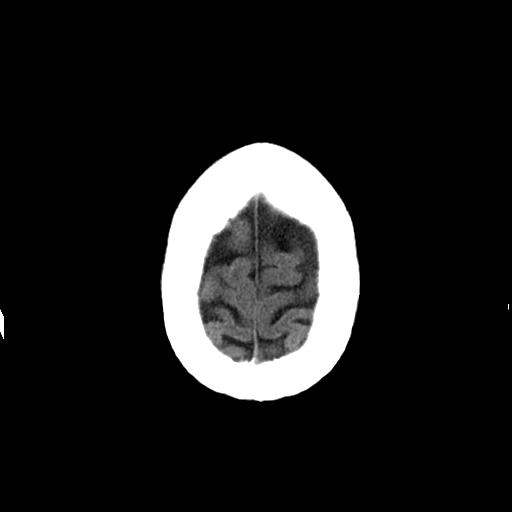
[im 23/29  bone]
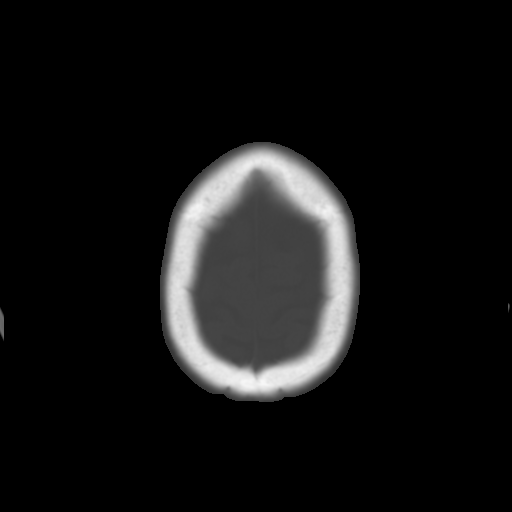
[im 25/29  brain]
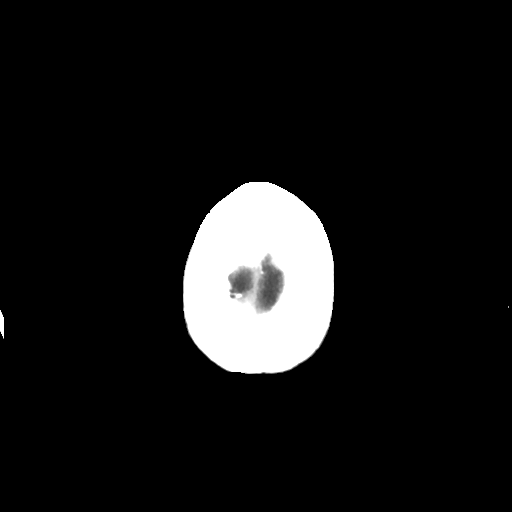
[im 27/29  brain]
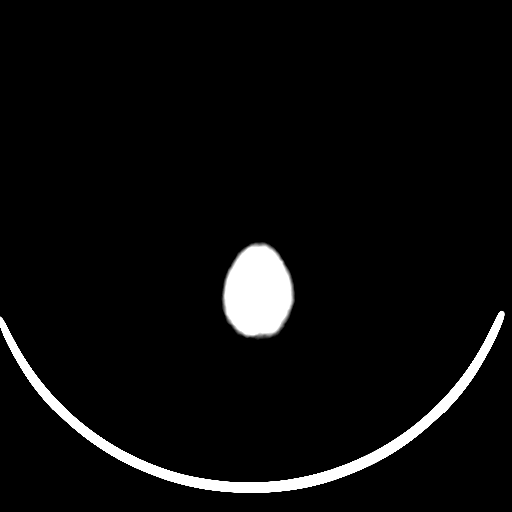

[Series 205: coronal st, idose (1) · coronal · 0.40mm/px · 3 of 63 slices shown]
[im 21/63  brain]
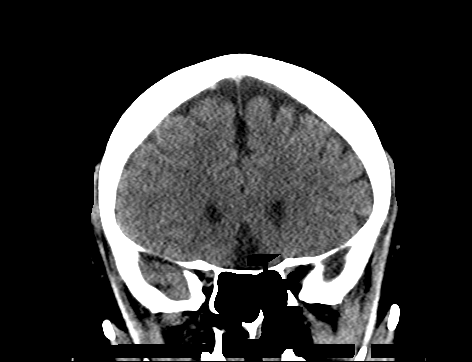
[im 28/63  brain]
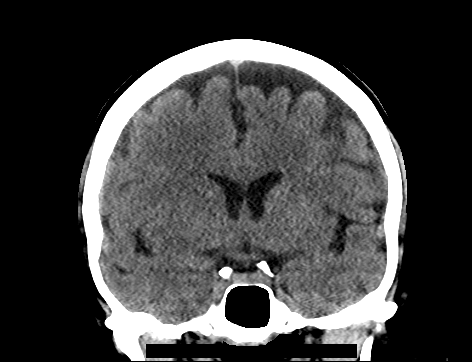
[im 35/63  brain]
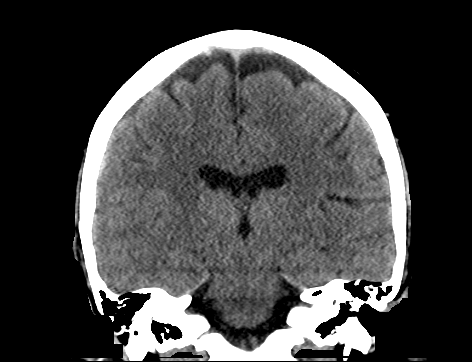

[Series 206: sagittal st, idose (1) · sagittal · 0.40mm/px · 3 of 47 slices shown]
[im 16/47  brain]
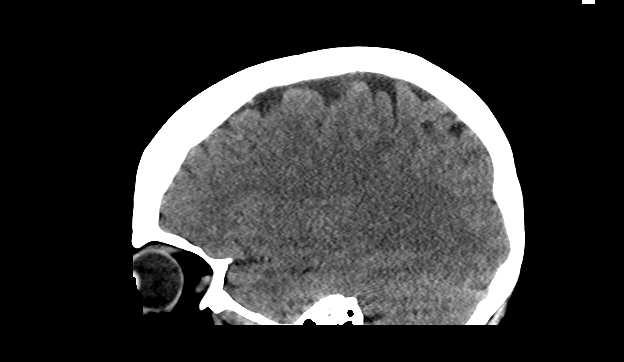
[im 24/47  brain]
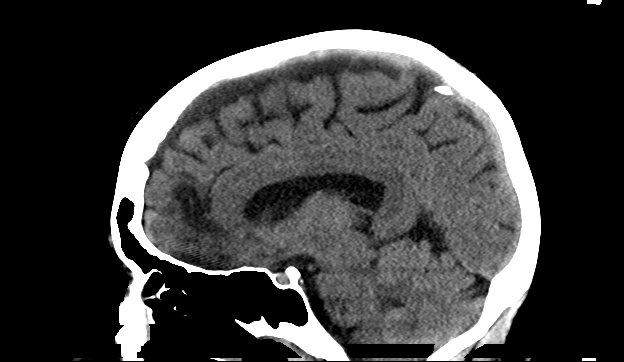
[im 31/47  brain]
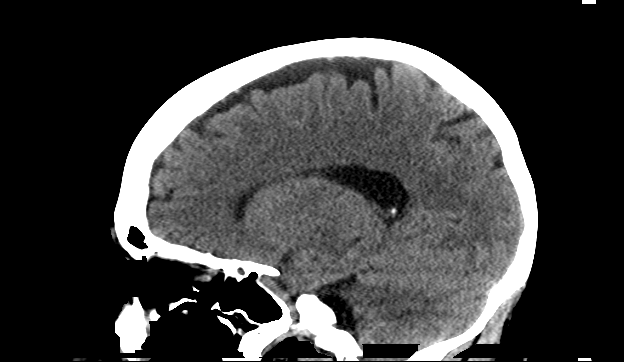

[17 of 47 positions shown; findings below may reference images not displayed]

FINDINGS: Hyperdense right MCA M1 (series 201, image 9). No CT changes of
acute right MCA territory infarct. See CTA findings today reported
separately.

ASPECTS score = 10, with regard to the right MCA territory.

[place] Stroke Program Early CT Score

Normal score = 10

New bilateral ACA territory encephalomalacia since 10/09/2014. Mild
posterior insula left MCA territory encephalomalacia corresponding
to the [REDACTED] ischemia. Posterior fossa gray-white matter
differentiation remains normal.

No acute intracranial hemorrhage identified. No midline shift, mass
effect, or evidence of intracranial mass lesion. No
ventriculomegaly.

No acute osseous abnormality identified. Stable and well pneumatized
visualized paranasal sinuses and mastoids. Rightward gaze deviation.
Negative scalp soft tissues.
IMPRESSION: 1. Hyperdense right M1 segment, see CTA findings reported
separately.
2. No CT changes of right MCA infarct identified; ASPECTS = 10. No
acute intracranial hemorrhage.
3. The above was discussed by telephone with Dr. Bekkii Uzun
on 01/28/2016 at 2179 hours.
4. New but chronic appearing bilateral anterior ACA territory
infarcts since [REDACTED]. Expected evolution of the left MCA insula
infarcts seen at that time.

## 2017-08-02 IMAGING — CT CT HEAD W/O CM
4 series · 16 of 47 positions shown, 18 images · non-contrast
Comparison: 01/28/2016 at [DATE] p.m. as well as 10/08/2014, CTA head
with contrast 01/28/2016 at [DATE] p.m.

CLINICAL DATA: Right MCA occlusion post cerebral
angiogram/intervention.

EXAM:
CT HEAD WITHOUT CONTRAST
TECHNIQUE: Contiguous axial images were obtained from the base of the skull
through the vertex without intravenous contrast.

[Series 2: head without · axial · non-contrast · 0.41mm/px · z∈[+1296,+1416]mm · 7 of 32 slices shown, 9 images]
[im 4/32  brain]
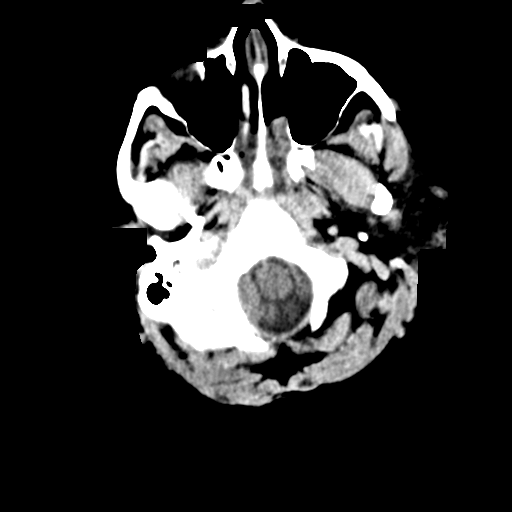
[im 4/32  bone]
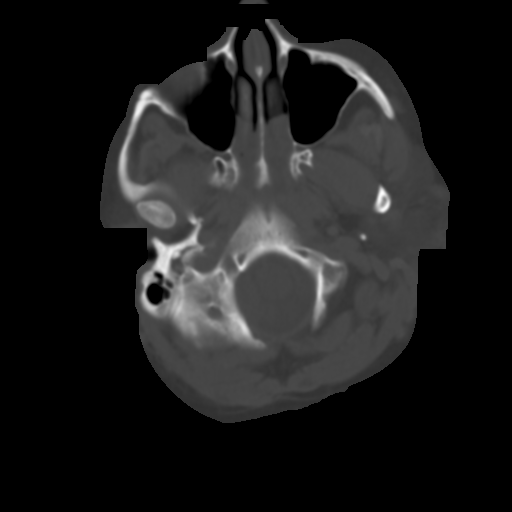
[im 8/32  brain]
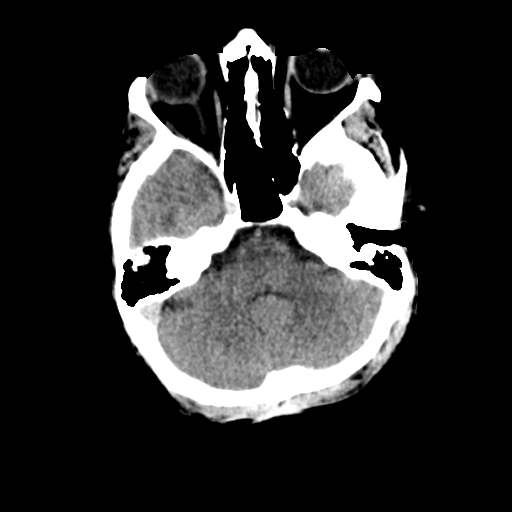
[im 12/32  brain]
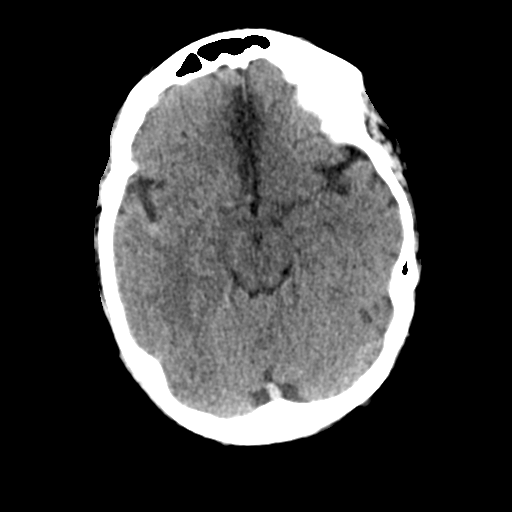
[im 16/32  brain]
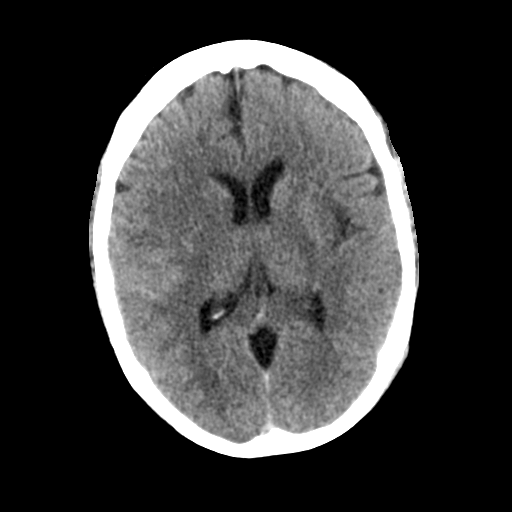
[im 20/32  brain]
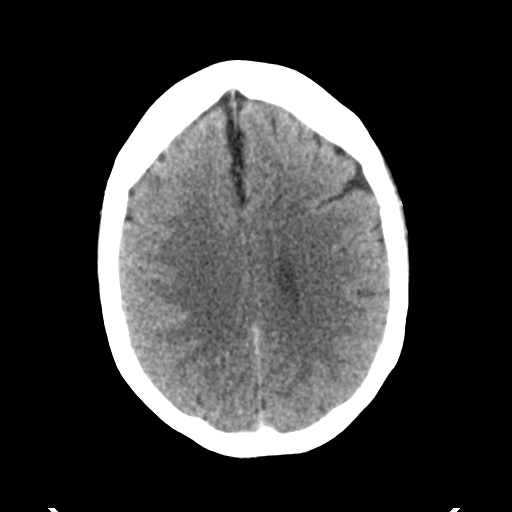
[im 20/32  bone]
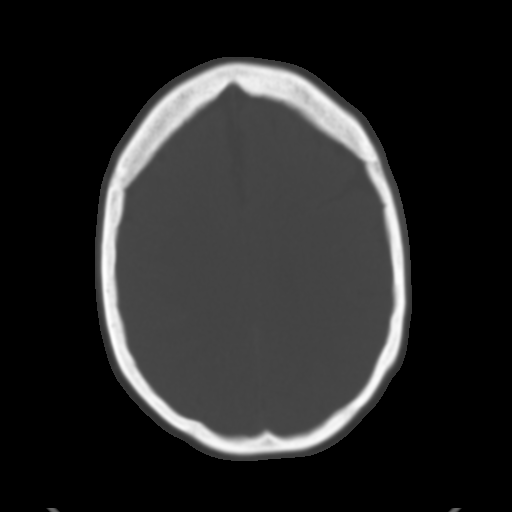
[im 24/32  brain]
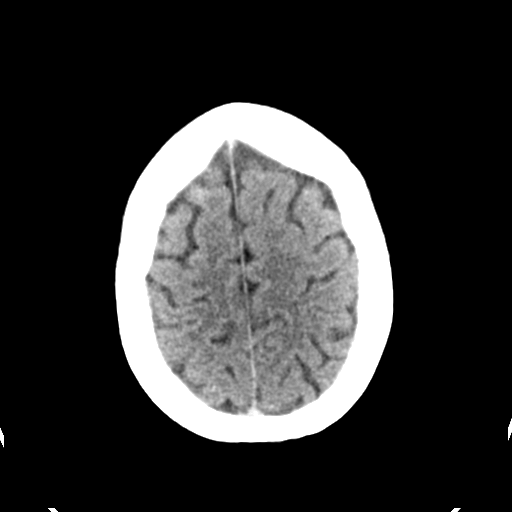
[im 28/32  brain]
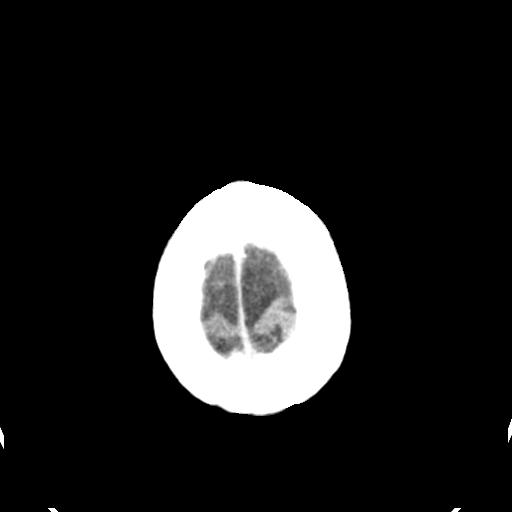

[Series 3: head bone · axial · 0.41mm/px · z∈[+1295,+1327]mm · 3 of 78 slices shown]
[im 8/78  bone]
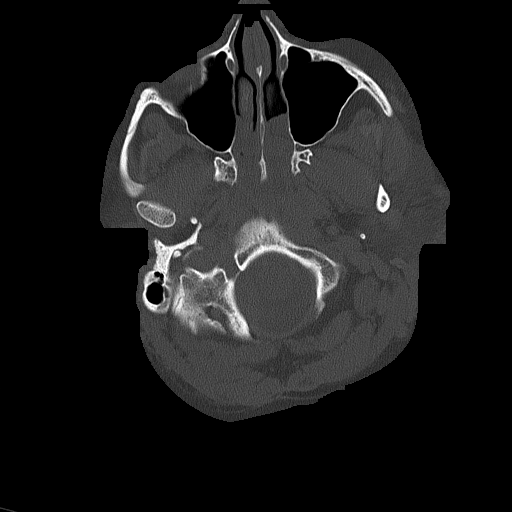
[im 16/78  bone]
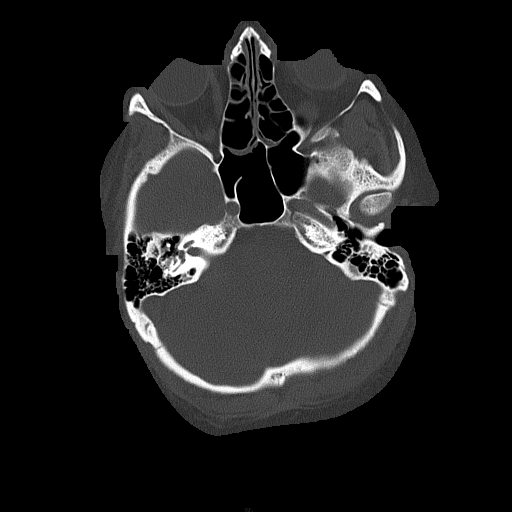
[im 24/78  bone]
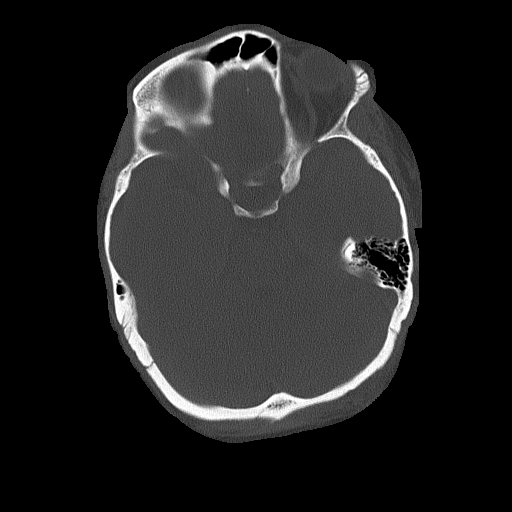

[Series 4: head without cor · coronal · non-contrast · 0.30mm/px · 3 of 62 slices shown]
[im 21/62  brain]
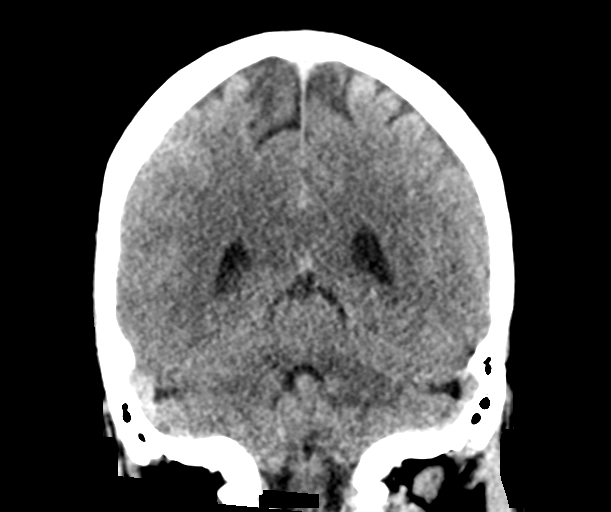
[im 28/62  brain]
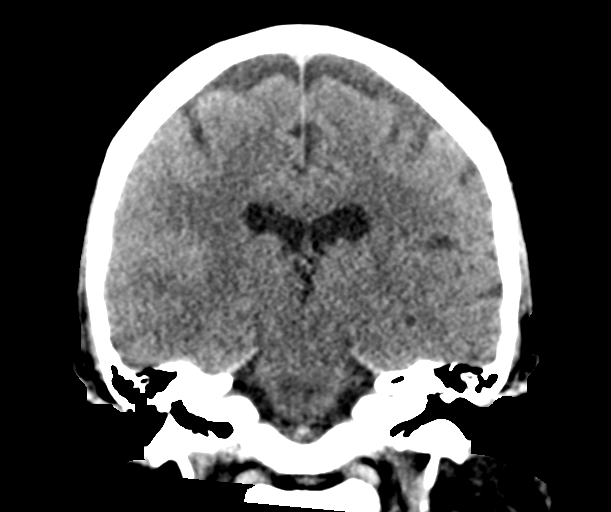
[im 34/62  brain]
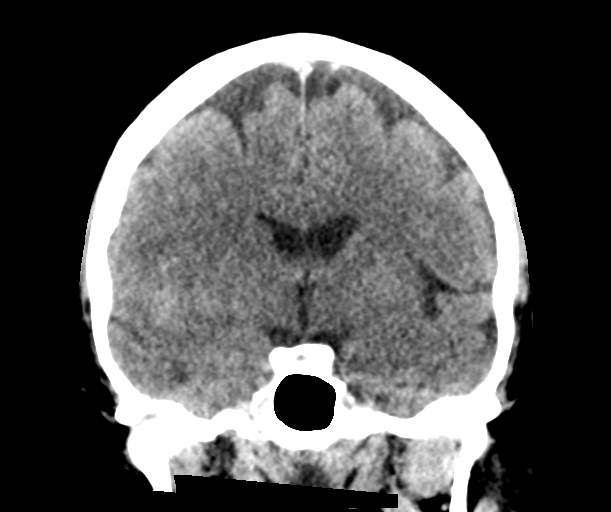

[Series 5: head without sag · sagittal · non-contrast · 0.30mm/px · 3 of 52 slices shown]
[im 18/52  brain]
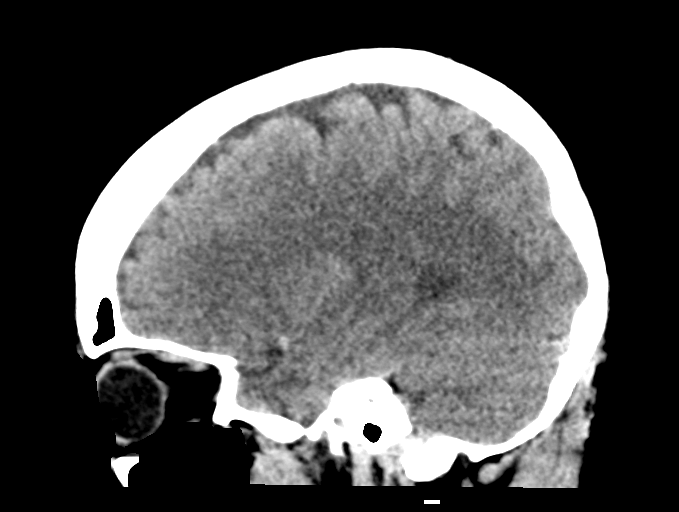
[im 26/52  brain]
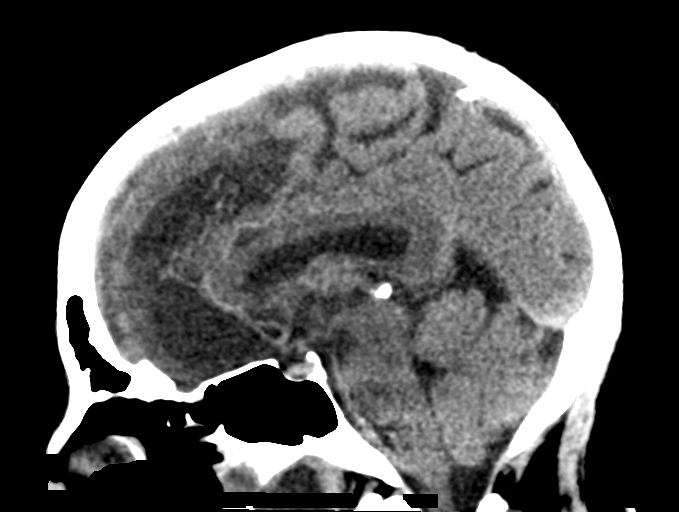
[im 35/52  brain]
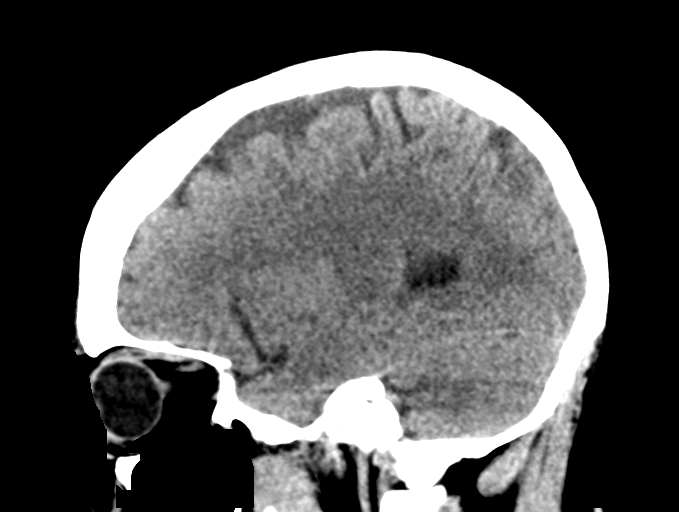

[16 of 47 positions shown; findings below may reference images not displayed]

FINDINGS: Ventricles and cisterns are within normal. Evidence of patient's
known bilateral old ACA territory infarcts. Mild increased density
over the right MCA with mild increased density over the right
temporal parietal gyri likely due to contrast injection from
patient's recent angiographic procedure. No mass, significant mass
effect or shift midline structures. No CT changes of acute
infarction. Remainder the exam is unchanged
IMPRESSION: Mild increased density over the right MCA and mild hyperdensity over
the gyri of the right MCA territory compatible with patient's known
MCA occlusion and recent angiographic procedure/intervention.
Otherwise, no significant change from recent prior exams.
